# Patient Record
Sex: Female | Born: 1950 | Race: Black or African American | Hispanic: No | Smoking: Former smoker
Health system: Southern US, Community
[De-identification: ages and names within clinical notes are randomized; demographics above are authoritative.]

## PROBLEM LIST (undated history)

## (undated) DIAGNOSIS — D649 Anemia, unspecified: Secondary | ICD-10-CM

## (undated) DIAGNOSIS — N390 Urinary tract infection, site not specified: Secondary | ICD-10-CM

## (undated) DIAGNOSIS — E119 Type 2 diabetes mellitus without complications: Secondary | ICD-10-CM

## (undated) DIAGNOSIS — D573 Sickle-cell trait: Secondary | ICD-10-CM

## (undated) DIAGNOSIS — D219 Benign neoplasm of connective and other soft tissue, unspecified: Secondary | ICD-10-CM

## (undated) DIAGNOSIS — IMO0001 Reserved for inherently not codable concepts without codable children: Secondary | ICD-10-CM

## (undated) DIAGNOSIS — C541 Malignant neoplasm of endometrium: Secondary | ICD-10-CM

## (undated) DIAGNOSIS — K219 Gastro-esophageal reflux disease without esophagitis: Secondary | ICD-10-CM

## (undated) HISTORY — PX: TUBAL LIGATION: SHX77

## (undated) HISTORY — PX: TONSILLECTOMY: SUR1361

---

## 1999-09-22 ENCOUNTER — Emergency Department (HOSPITAL_COMMUNITY): Admission: EM | Admit: 1999-09-22 | Discharge: 1999-09-22 | Payer: Self-pay | Admitting: Emergency Medicine

## 1999-09-22 ENCOUNTER — Encounter: Payer: Self-pay | Admitting: Emergency Medicine

## 2001-02-02 ENCOUNTER — Other Ambulatory Visit: Admission: RE | Admit: 2001-02-02 | Discharge: 2001-02-02 | Payer: Self-pay | Admitting: Internal Medicine

## 2003-01-02 ENCOUNTER — Ambulatory Visit (HOSPITAL_COMMUNITY): Admission: RE | Admit: 2003-01-02 | Discharge: 2003-01-02 | Payer: Self-pay | Admitting: Gastroenterology

## 2010-10-02 ENCOUNTER — Ambulatory Visit: Payer: Self-pay | Admitting: Internal Medicine

## 2010-11-02 ENCOUNTER — Ambulatory Visit: Payer: Self-pay | Admitting: Internal Medicine

## 2014-03-07 ENCOUNTER — Emergency Department (HOSPITAL_BASED_OUTPATIENT_CLINIC_OR_DEPARTMENT_OTHER)
Admission: EM | Admit: 2014-03-07 | Discharge: 2014-03-07 | Disposition: A | Discharge status: Home / Self Care | Payer: BC Managed Care – PPO | Attending: Emergency Medicine | Admitting: Emergency Medicine

## 2014-03-07 ENCOUNTER — Encounter (HOSPITAL_BASED_OUTPATIENT_CLINIC_OR_DEPARTMENT_OTHER): Payer: Self-pay | Admitting: *Deleted

## 2014-03-07 DIAGNOSIS — Z862 Personal history of diseases of the blood and blood-forming organs and certain disorders involving the immune mechanism: Secondary | ICD-10-CM | POA: Diagnosis not present

## 2014-03-07 DIAGNOSIS — Z8742 Personal history of other diseases of the female genital tract: Secondary | ICD-10-CM | POA: Diagnosis not present

## 2014-03-07 DIAGNOSIS — R0981 Nasal congestion: Secondary | ICD-10-CM | POA: Diagnosis not present

## 2014-03-07 DIAGNOSIS — H81399 Other peripheral vertigo, unspecified ear: Secondary | ICD-10-CM

## 2014-03-07 DIAGNOSIS — R42 Dizziness and giddiness: Secondary | ICD-10-CM | POA: Diagnosis present

## 2014-03-07 DIAGNOSIS — Z87891 Personal history of nicotine dependence: Secondary | ICD-10-CM | POA: Insufficient documentation

## 2014-03-07 HISTORY — DX: Sickle-cell trait: D57.3

## 2014-03-07 HISTORY — DX: Benign neoplasm of connective and other soft tissue, unspecified: D21.9

## 2014-03-07 MED ORDER — MECLIZINE HCL 25 MG PO TABS: 25.0000 mg | ORAL_TABLET | Freq: Three times a day (TID) | ORAL | Status: DC | PRN

## 2014-03-07 NOTE — Discharge Instructions (Signed)

## 2014-03-07 NOTE — ED Notes (Signed)
Pt amb to triage with quick steady gait in nad. Pt reports having "a bad cold" x 2 weeks, yesterday pt noticed "the room was spinning around when I would turn different ways, it stopped when I closed my eyes..." pt denies any dizzy feeling at this time, denies any cp, sob or other c/o.

## 2014-03-07 NOTE — ED Notes (Signed)
C/o dizziness onset yesterday worse when lying on rt side  goes away when moving to rt side  States has had productive cough thick sputum x 2 weeks and congestion

## 2014-03-07 NOTE — ED Provider Notes (Signed)
CSN: [id number]     Arrival date & time 03/07/14  1705 History   First MD Initiated Contact with Patient 03/07/14 1946     This chart was scribed for Dorie Rank, MD by Forrestine Him, ED Scribe. This patient was seen in room MH07/MH07 and the patient's care was started 7:53 PM.   Chief Complaint  Patient presents with  . Dizziness   Patient is a 63 y.o. female presenting with dizziness. The history is provided by the patient. No language interpreter was used.  Dizziness Quality:  Head spinning Onset quality:  Sudden Duration:  2 weeks Timing:  Intermittent Progression:  Unchanged Chronicity:  New Context: head movement and standing up   Relieved by:  Closing eyes Worsened by:  Movement, standing up and turning head Ineffective treatments:  None tried Associated symptoms: no chest pain, no hearing loss, no shortness of breath and no vision changes     HPI Comments: Kristen Greene is a 63 y.o. female with a PMHx of sickle-cell trait who presents to the Emergency Department complaining of intermittent, moderate dizziness with associated difficulty keeping balance x 2 weeks. Dizziness is worsened with movment, turning from side to side, and standing. Symptoms are alleviated with sitting and when closing both eyes. States she recently had a "bad cold" several days ago and still reports ongoing congestion without any noticeable drainage. Kristen Greene denies any fever, SOB, CP, slurred speech, or vision changes. No known allergies to medications. No other concerns his visit.  Past Medical History  Diagnosis Date  . Sickle-cell trait   . Fibroids    History reviewed. No pertinent past surgical history. History reviewed. No pertinent family history. History  Substance Use Topics  . Smoking status: Former Research scientist (life sciences)  . Smokeless tobacco: Not on file  . Alcohol Use: Not on file   OB History    No data available     Review of Systems  Constitutional: Negative for fever and chills.  HENT:  Positive for congestion. Negative for hearing loss.   Eyes: Negative for visual disturbance.  Respiratory: Negative for shortness of breath.   Cardiovascular: Negative for chest pain.  Neurological: Positive for dizziness. Negative for speech difficulty.  All other systems reviewed and are negative.     Allergies  Review of patient's allergies indicates no known allergies.  Home Medications   Prior to Admission medications   Not on File   Triage Vitals: BP 155/96 mmHg  Pulse 85  Temp(Src) 98.2 F (36.8 C) (Oral)  Resp 18  SpO2 99%   Physical Exam  Constitutional: She is oriented to person, place, and time. She appears well-developed and well-nourished. No distress.  HENT:  Head: Normocephalic and atraumatic.  Right Ear: External ear normal.  Left Ear: External ear normal.  Mouth/Throat: Oropharynx is clear and moist.  Eyes: Conjunctivae are normal. Right eye exhibits no discharge. Left eye exhibits no discharge. No scleral icterus.  Neck: Neck supple. No tracheal deviation present.  Cardiovascular: Normal rate, regular rhythm and intact distal pulses.   Pulmonary/Chest: Effort normal and breath sounds normal. No stridor. No respiratory distress. She has no wheezes. She has no rales.  Abdominal: Soft. Bowel sounds are normal. She exhibits no distension. There is no tenderness. There is no rebound and no guarding.  Musculoskeletal: She exhibits no edema or tenderness.  Neurological: She is alert and oriented to person, place, and time. She has normal strength. No cranial nerve deficit (no facial droop, extraocular movements intact, no slurred speech)  or sensory deficit. She exhibits normal muscle tone. She displays no seizure activity. Coordination normal.  No pronator drift bilateral upper extrem, able to hold both legs off bed for 5 seconds, sensation intact in all extremities, no visual field cuts, no left or right sided neglect, normal finger-nose exam bilaterally, no  nystagmus noted   Skin: Skin is warm and dry. No rash noted.  Psychiatric: She has a normal mood and affect.  Nursing note and vitals reviewed.   ED Course  Procedures (including critical care time)  DIAGNOSTIC STUDIES: Oxygen Saturation is 99% on RA, Normal by my interpretation.    COORDINATION OF CARE: 7:52 PM-Discussed treatment plan with pt at bedside and pt agreed to plan.     Labs Review Labs Reviewed - No data to display  Imaging Review No results found.   EKG Interpretation   Date/Time:  Thursday March 07 2014 17:37:39 EST Ventricular Rate:  84 PR Interval:  162 QRS Duration: 102 QT Interval:  392 QTC Calculation: 463 R Axis:   -13 Text Interpretation:  Normal sinus rhythm Right atrial enlargement Minimal  voltage criteria for LVH, may be normal variant Nonspecific ST and T wave  abnormality , new since last tracing Confirmed by Prachi Oftedahl  MD-J, Corrin Sieling ([id number])  on 03/07/2014 5:44:58 PM      MDM   Final diagnoses:  Peripheral vertigo, unspecified laterality    The patient's symptoms are consistent with a viral upper respiratory infection. She is having some sinus congestion which I  Suspect is the etiology for her vertigo.  Normal neuro exam, doubt central cause.  Her symptoms are improving.  Will dc home with rx for meclizine.  Follow up with PCP.    I personally performed the services described in this documentation, which was scribed in my presence. The recorded information has been reviewed and is accurate.    Dorie Rank, MD 03/07/14 2022

## 2015-04-24 DIAGNOSIS — IMO0002 Reserved for concepts with insufficient information to code with codable children: Secondary | ICD-10-CM | POA: Diagnosis present

## 2015-04-24 DIAGNOSIS — N950 Postmenopausal bleeding: Secondary | ICD-10-CM | POA: Diagnosis present

## 2015-04-24 NOTE — H&P (Signed)
Kristen Greene is an 64 y.o. female.  [id number] with postmenoapusal bleeding and abnormal pap. Pt had her first pap visit in >10years on 03/31/15. It returned as HGSIL.  She reports underwent menopause in her 66s however in past 1-2 years begun having bleeding again. She saw a gynecologist a year ago who told her she had a polyp and needed surgery but he/she never followed up to schedule appt with her. She did know of a fibroid from her 61s. She denies any other associated symptoms such as bloating or pelvic pain. She denies any hot flashes or night sweats. U/S showed a thickended endometrium and a calcified fibroid in mid lower uterine wall. There was also an interuterine mass with a heterogenous echo pattern. Pt consented for D&C hysteroscopy, possible myosure and LEEP   Pertinent Gynecological History: Menses: post-menopausal Bleeding: post menopausal bleeding Contraception: none DES exposure: unknown Blood transfusions: none Sexually transmitted diseases: no past history Previous GYN Procedures: none  Last mammogram: normal Date: 04/2010 Last pap: abnormal: hgsil Date: 03/31/15 OB History: G4, P3013   Menstrual History: Menarche age: 74  No LMP recorded. Patient is postmenopausal.    Past Medical History  Diagnosis Date  . Sickle-cell trait   . Fibroids     No past surgical history on file.  No family history on file.  Social History:  reports that she has quit smoking. She does not have any smokeless tobacco history on file. Her alcohol and drug histories are not on file.  Allergies: No Known Allergies  No prescriptions prior to admission    Review of Systems  Constitutional: Negative for fever, chills, weight loss and malaise/fatigue.  Eyes: Negative for blurred vision.  Respiratory: Negative for shortness of breath.   Cardiovascular: Negative for chest pain.  Gastrointestinal: Positive for abdominal pain. Negative for heartburn, nausea and vomiting.  Musculoskeletal:  Negative for back pain.  Neurological: Negative for dizziness, weakness and headaches.  Psychiatric/Behavioral: Negative for depression. The patient is nervous/anxious.     There were no vitals taken for this visit. Physical Exam  Constitutional: She is oriented to person, place, and time. She appears well-developed and well-nourished.  HENT:  Head: Normocephalic.  Neck: Normal range of motion.  Cardiovascular: Normal rate.   Respiratory: Effort normal.  Genitourinary: Vagina normal and uterus normal.  Musculoskeletal: Normal range of motion.  Neurological: She is alert and oriented to person, place, and time.  Skin: Skin is warm.  Psychiatric: She has a normal mood and affect. Her behavior is normal. Judgment and thought content normal.    No results found for this or any previous visit (from the past 24 hour(s)).  No results found.  Assessment/Plan: Postmenopausal bleeding with ?endometrial fibroid and or mass, thickened endometrium and HGSIL on pap TO OR for D&C hysteroscopy, possible myosure and LEEP R/B reviewed  Sherlyn Hay 04/24/2015, 12:28 PM

## 2015-05-06 NOTE — Patient Instructions (Signed)
Your procedure is scheduled on:  Wednesday, Jan. 18, 2017  Enter through the [employer] [facility] at:  7:00 A.M.  Pick up the phone at the desk and dial [phone].  Call this number if you have problems the morning of surgery: [phone].  Remember: Do NOT eat food or drink after:  Midnight Tuesday Take these medicines the morning of surgery with a SIP OF WATER:  None  Do NOT wear jewelry (body piercing), metal hair clips/bobby pins, make-up, or nail polish. Do NOT wear lotions, powders, or perfumes.  You may wear deoderant. Do NOT shave for 48 hours prior to surgery. Do NOT bring valuables to the hospital. Contacts, dentures, or bridgework may not be worn into surgery. Have a responsible adult drive you home and stay with you for 24 hours after your procedure

## 2015-05-07 ENCOUNTER — Encounter (HOSPITAL_COMMUNITY): Payer: Self-pay

## 2015-05-07 ENCOUNTER — Emergency Department (HOSPITAL_COMMUNITY): Payer: No Typology Code available for payment source

## 2015-05-07 ENCOUNTER — Emergency Department (HOSPITAL_COMMUNITY)
Admission: EM | Admit: 2015-05-07 | Discharge: 2015-05-07 | Disposition: A | Discharge status: Home / Self Care | Payer: No Typology Code available for payment source | Attending: Emergency Medicine | Admitting: Emergency Medicine

## 2015-05-07 ENCOUNTER — Other Ambulatory Visit: Payer: Self-pay

## 2015-05-07 ENCOUNTER — Encounter (HOSPITAL_COMMUNITY)
Admission: RE | Admit: 2015-05-07 | Discharge: 2015-05-07 | Disposition: A | Discharge status: Home / Self Care | Payer: PPO | Source: Ambulatory Visit | Attending: Obstetrics and Gynecology | Admitting: Obstetrics and Gynecology

## 2015-05-07 DIAGNOSIS — R896 Abnormal cytological findings in specimens from other organs, systems and tissues: Secondary | ICD-10-CM | POA: Insufficient documentation

## 2015-05-07 DIAGNOSIS — Y998 Other external cause status: Secondary | ICD-10-CM | POA: Insufficient documentation

## 2015-05-07 DIAGNOSIS — N950 Postmenopausal bleeding: Secondary | ICD-10-CM | POA: Insufficient documentation

## 2015-05-07 DIAGNOSIS — Z01812 Encounter for preprocedural laboratory examination: Secondary | ICD-10-CM | POA: Insufficient documentation

## 2015-05-07 DIAGNOSIS — E119 Type 2 diabetes mellitus without complications: Secondary | ICD-10-CM | POA: Insufficient documentation

## 2015-05-07 DIAGNOSIS — Z87891 Personal history of nicotine dependence: Secondary | ICD-10-CM | POA: Insufficient documentation

## 2015-05-07 DIAGNOSIS — S4991XA Unspecified injury of right shoulder and upper arm, initial encounter: Secondary | ICD-10-CM | POA: Diagnosis present

## 2015-05-07 DIAGNOSIS — Z86018 Personal history of other benign neoplasm: Secondary | ICD-10-CM | POA: Diagnosis not present

## 2015-05-07 DIAGNOSIS — S43401A Unspecified sprain of right shoulder joint, initial encounter: Secondary | ICD-10-CM | POA: Diagnosis not present

## 2015-05-07 DIAGNOSIS — Y9389 Activity, other specified: Secondary | ICD-10-CM | POA: Diagnosis not present

## 2015-05-07 DIAGNOSIS — Y92410 Unspecified street and highway as the place of occurrence of the external cause: Secondary | ICD-10-CM | POA: Insufficient documentation

## 2015-05-07 DIAGNOSIS — Z862 Personal history of diseases of the blood and blood-forming organs and certain disorders involving the immune mechanism: Secondary | ICD-10-CM | POA: Insufficient documentation

## 2015-05-07 HISTORY — DX: Type 2 diabetes mellitus without complications: E11.9

## 2015-05-07 LAB — CBC
HCT: 31.9 % — ABNORMAL LOW (ref 36.0–46.0)
HEMOGLOBIN: 10.2 g/dL — AB (ref 12.0–15.0)
MCH: 24.9 pg — AB (ref 26.0–34.0)
MCHC: 32 g/dL (ref 30.0–36.0)
MCV: 78 fL (ref 78.0–100.0)
PLATELETS: 329 10*3/uL (ref 150–400)
RBC: 4.09 MIL/uL (ref 3.87–5.11)
RDW: 14.1 % (ref 11.5–15.5)
WBC: 5.4 10*3/uL (ref 4.0–10.5)

## 2015-05-07 LAB — BASIC METABOLIC PANEL
ANION GAP: 11 (ref 5–15)
BUN: 11 mg/dL (ref 6–20)
CALCIUM: 9.1 mg/dL (ref 8.9–10.3)
CO2: 27 mmol/L (ref 22–32)
CREATININE: 0.72 mg/dL (ref 0.44–1.00)
Chloride: 101 mmol/L (ref 101–111)
GLUCOSE: 101 mg/dL — AB (ref 65–99)
Potassium: 3.8 mmol/L (ref 3.5–5.1)
Sodium: 139 mmol/L (ref 135–145)

## 2015-05-07 MED ORDER — NAPROXEN 500 MG PO TABS: 500.0000 mg | ORAL_TABLET | Freq: Two times a day (BID) | ORAL | Status: DC

## 2015-05-07 MED ORDER — METHOCARBAMOL 500 MG PO TABS: 500.0000 mg | ORAL_TABLET | Freq: Two times a day (BID) | ORAL | Status: DC
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
## 2015-05-14 ENCOUNTER — Encounter (HOSPITAL_COMMUNITY)
Admission: RE | Disposition: A | Discharge status: Home / Self Care | Payer: Self-pay | Source: Ambulatory Visit | Attending: Obstetrics and Gynecology

## 2015-05-14 ENCOUNTER — Ambulatory Visit (HOSPITAL_COMMUNITY)
Admission: RE | Admit: 2015-05-14 | Discharge: 2015-05-14 | Disposition: A | Discharge status: Home / Self Care | Payer: PPO | Source: Ambulatory Visit | Attending: Obstetrics and Gynecology | Admitting: Obstetrics and Gynecology

## 2015-05-14 ENCOUNTER — Ambulatory Visit (HOSPITAL_COMMUNITY): Payer: PPO | Admitting: Certified Registered Nurse Anesthetist

## 2015-05-14 ENCOUNTER — Encounter (HOSPITAL_COMMUNITY): Payer: Self-pay | Admitting: Certified Registered Nurse Anesthetist

## 2015-05-14 DIAGNOSIS — Z87891 Personal history of nicotine dependence: Secondary | ICD-10-CM | POA: Diagnosis not present

## 2015-05-14 DIAGNOSIS — N950 Postmenopausal bleeding: Secondary | ICD-10-CM | POA: Insufficient documentation

## 2015-05-14 DIAGNOSIS — R87613 High grade squamous intraepithelial lesion on cytologic smear of cervix (HGSIL): Secondary | ICD-10-CM | POA: Diagnosis not present

## 2015-05-14 DIAGNOSIS — IMO0002 Reserved for concepts with insufficient information to code with codable children: Secondary | ICD-10-CM | POA: Diagnosis present

## 2015-05-14 HISTORY — PX: DILATATION & CURETTAGE/HYSTEROSCOPY WITH MYOSURE: SHX6511

## 2015-05-14 HISTORY — PX: LEEP: SHX91

## 2015-05-14 SURGERY — DILATATION & CURETTAGE/HYSTEROSCOPY WITH MYOSURE
Anesthesia: General | Site: Vagina

## 2015-05-14 MED ORDER — LIDOCAINE-EPINEPHRINE 1 %-1:100000 IJ SOLN
INTRAMUSCULAR | Status: DC | PRN
  Administered 2015-05-14: 10 mL
  Administered 2015-05-14: 8 mL

## 2015-05-14 MED ORDER — LIDOCAINE HCL (CARDIAC) 20 MG/ML IV SOLN
INTRAVENOUS | Status: DC | PRN
  Administered 2015-05-14: 50 mg via INTRAVENOUS

## 2015-05-14 MED ORDER — KETOROLAC TROMETHAMINE 30 MG/ML IJ SOLN
INTRAMUSCULAR | Status: AC
  Filled 2015-05-14: qty 1

## 2015-05-14 MED ORDER — KETOROLAC TROMETHAMINE 30 MG/ML IJ SOLN: 30.0000 mg | Freq: Once | INTRAMUSCULAR | Status: DC

## 2015-05-14 MED ORDER — MIDAZOLAM HCL 2 MG/2ML IJ SOLN
INTRAMUSCULAR | Status: AC
  Filled 2015-05-14: qty 2

## 2015-05-14 MED ORDER — LACTATED RINGERS IV SOLN
INTRAVENOUS | Status: DC
  Administered 2015-05-14 (×2): via INTRAVENOUS

## 2015-05-14 MED ORDER — PROPOFOL 10 MG/ML IV BOLUS
INTRAVENOUS | Status: AC
  Filled 2015-05-14: qty 20

## 2015-05-14 MED ORDER — EPHEDRINE SULFATE 50 MG/ML IJ SOLN
INTRAMUSCULAR | Status: DC | PRN
  Administered 2015-05-14 (×2): 10 mg via INTRAVENOUS

## 2015-05-14 MED ORDER — SODIUM CHLORIDE 0.9 % IR SOLN
Status: DC | PRN
  Administered 2015-05-14: 3000 mL

## 2015-05-14 MED ORDER — FERRIC SUBSULFATE SOLN
Status: DC | PRN
  Administered 2015-05-14: 1

## 2015-05-14 MED ORDER — ACETIC ACID 5 % SOLN
Status: AC
  Filled 2015-05-14: qty 500

## 2015-05-14 MED ORDER — ONDANSETRON HCL 4 MG/2ML IJ SOLN
INTRAMUSCULAR | Status: AC
  Filled 2015-05-14: qty 2

## 2015-05-14 MED ORDER — OXYCODONE-ACETAMINOPHEN 5-325 MG PO TABS: 1.0000 | ORAL_TABLET | ORAL | Status: DC | PRN

## 2015-05-14 MED ORDER — FENTANYL CITRATE (PF) 100 MCG/2ML IJ SOLN
INTRAMUSCULAR | Status: AC
  Filled 2015-05-14: qty 2

## 2015-05-14 MED ORDER — HYDROCODONE-ACETAMINOPHEN 7.5-325 MG PO TABS: 1.0000 | ORAL_TABLET | Freq: Once | ORAL | Status: DC | PRN

## 2015-05-14 MED ORDER — ONDANSETRON HCL 4 MG/2ML IJ SOLN: 4.0000 mg | Freq: Once | INTRAMUSCULAR | Status: DC | PRN

## 2015-05-14 MED ORDER — LIDOCAINE HCL 1 % IJ SOLN
INTRAMUSCULAR | Status: AC
  Filled 2015-05-14: qty 20

## 2015-05-14 MED ORDER — ACETIC ACID 4% SOLUTION
Status: DC | PRN
  Administered 2015-05-14: 1 via TOPICAL

## 2015-05-14 MED ORDER — FENTANYL CITRATE (PF) 100 MCG/2ML IJ SOLN: 25.0000 ug | INTRAMUSCULAR | Status: DC | PRN

## 2015-05-14 MED ORDER — IODINE STRONG (LUGOLS) 5 % PO SOLN
ORAL | Status: AC
  Filled 2015-05-14: qty 1

## 2015-05-14 MED ORDER — MIDAZOLAM HCL 2 MG/2ML IJ SOLN
INTRAMUSCULAR | Status: DC | PRN
  Administered 2015-05-14 (×2): 1 mg via INTRAVENOUS

## 2015-05-14 MED ORDER — LIDOCAINE-EPINEPHRINE 1 %-1:100000 IJ SOLN
INTRAMUSCULAR | Status: AC
  Filled 2015-05-14: qty 1

## 2015-05-14 MED ORDER — PROPOFOL 10 MG/ML IV BOLUS
INTRAVENOUS | Status: DC | PRN
  Administered 2015-05-14: 30 mg via INTRAVENOUS
  Administered 2015-05-14: 150 mg via INTRAVENOUS

## 2015-05-14 MED ORDER — EPHEDRINE 5 MG/ML INJ
INTRAVENOUS | Status: AC
  Filled 2015-05-14: qty 10

## 2015-05-14 MED ORDER — KETOROLAC TROMETHAMINE 30 MG/ML IJ SOLN
INTRAMUSCULAR | Status: DC | PRN
  Administered 2015-05-14: 30 mg via INTRAVENOUS

## 2015-05-14 MED ORDER — ONDANSETRON HCL 4 MG/2ML IJ SOLN
INTRAMUSCULAR | Status: DC | PRN
  Administered 2015-05-14: 4 mg via INTRAVENOUS

## 2015-05-14 MED ORDER — DEXAMETHASONE SODIUM PHOSPHATE 4 MG/ML IJ SOLN
INTRAMUSCULAR | Status: AC
  Filled 2015-05-14: qty 1

## 2015-05-14 MED ORDER — DEXAMETHASONE SODIUM PHOSPHATE 10 MG/ML IJ SOLN
INTRAMUSCULAR | Status: DC | PRN
  Administered 2015-05-14: 4 mg via INTRAVENOUS

## 2015-05-14 MED ORDER — IBUPROFEN 200 MG PO TABS: 600.0000 mg | ORAL_TABLET | Freq: Four times a day (QID) | ORAL | Status: DC | PRN

## 2015-05-14 MED ORDER — LACTATED RINGERS IV SOLN: INTRAVENOUS | Status: DC

## 2015-05-14 MED ORDER — MEPERIDINE HCL 25 MG/ML IJ SOLN
6.2500 mg | INTRAMUSCULAR | Status: DC | PRN
Start: 2015-05-14 — End: 2015-05-14

## 2015-05-14 MED ORDER — FENTANYL CITRATE (PF) 100 MCG/2ML IJ SOLN
INTRAMUSCULAR | Status: DC | PRN
  Administered 2015-05-14 (×2): 25 ug via INTRAVENOUS
  Administered 2015-05-14: 50 ug via INTRAVENOUS

## 2015-05-14 MED ORDER — FERRIC SUBSULFATE 259 MG/GM EX SOLN
CUTANEOUS | Status: AC
  Filled 2015-05-14: qty 8

## 2015-05-14 SURGICAL SUPPLY — 35 items
APPLICATOR COTTON TIP 6IN STRL (MISCELLANEOUS) ×3 IMPLANT
BLADE 11 SAFETY STRL DISP (BLADE) ×3 IMPLANT
CANISTER SUCT 3000ML (MISCELLANEOUS) ×3 IMPLANT
CATH ROBINSON RED A/P 16FR (CATHETERS) ×3 IMPLANT
CLOTH BEACON ORANGE TIMEOUT ST (SAFETY) ×3 IMPLANT
CONTAINER PREFILL 10% NBF 60ML (FORM) ×6 IMPLANT
COUNTER NEEDLE 1200 MAGNETIC (NEEDLE) ×2 IMPLANT
DEVICE MYOSURE LITE (MISCELLANEOUS) IMPLANT
DEVICE MYOSURE REACH (MISCELLANEOUS) IMPLANT
ELECT LOOP LEEP RND 15X12 GRN (CUTTING LOOP) ×3
ELECT LOOP LEEP RND 20X12 WHT (CUTTING LOOP)
ELECT REM PT RETURN 9FT ADLT (ELECTROSURGICAL) ×3
ELECTRODE LOOP LP RND 15X12GRN (CUTTING LOOP) IMPLANT
ELECTRODE LOOP LP RND 20X12WHT (CUTTING LOOP) IMPLANT
ELECTRODE REM PT RTRN 9FT ADLT (ELECTROSURGICAL) ×1 IMPLANT
EXTENDER ELECT LOOP LEEP 10CM (CUTTING LOOP) IMPLANT
GLOVE BIO SURGEON STRL SZ 6.5 (GLOVE) ×2 IMPLANT
GLOVE BIO SURGEONS STRL SZ 6.5 (GLOVE) ×1
GLOVE BIOGEL PI IND STRL 7.0 (GLOVE) ×2 IMPLANT
GLOVE BIOGEL PI INDICATOR 7.0 (GLOVE) ×4
GOWN STRL REUS W/TWL LRG LVL3 (GOWN DISPOSABLE) ×6 IMPLANT
HOSE NS SMOKE EVAC 7/8 X6 (MISCELLANEOUS) ×2 IMPLANT
HOSE NS SMOKE EVAC 7/8 X6' (MISCELLANEOUS) ×1
NS IRRIG 1000ML POUR BTL (IV SOLUTION) ×3 IMPLANT
PACK VAGINAL MINOR WOMEN LF (CUSTOM PROCEDURE TRAY) ×3 IMPLANT
PAD OB MATERNITY 4.3X12.25 (PERSONAL CARE ITEMS) ×3 IMPLANT
PENCIL BUTTON HOLSTER BLD 10FT (ELECTRODE) ×3 IMPLANT
REDUCER FITTING SMOKE EVAC (MISCELLANEOUS) ×3 IMPLANT
SCOPETTES 8  STERILE (MISCELLANEOUS) ×4
SCOPETTES 8 STERILE (MISCELLANEOUS) ×2 IMPLANT
TOWEL OR 17X24 6PK STRL BLUE (TOWEL DISPOSABLE) ×6 IMPLANT
TUBING AQUILEX INFLOW (TUBING) ×3 IMPLANT
TUBING AQUILEX OUTFLOW (TUBING) ×3 IMPLANT
TUBING SMOKE EVAC HOSE ADAPTER (MISCELLANEOUS) ×3 IMPLANT
WATER STERILE IRR 1000ML POUR (IV SOLUTION) ×3 IMPLANT

## 2015-05-14 NOTE — Interval H&P Note (Signed)
History and Physical Interval Note:  05/14/2015 8:16 AM    Kristen Greene  has presented today for surgery, with the diagnosis of postmenopausal bleeding, HGSIL  The various methods of treatment have been discussed with the patient and family. After consideration of risks, benefits and other options for treatment, the patient has consented to  Procedure(s): DILATATION & CURETTAGE/HYSTEROSCOPY WITH MYOSURE (N/A) LOOP ELECTROSURGICAL EXCISION PROCEDURE (LEEP) (N/A) as a surgical intervention .  The patient's history has been reviewed, patient examined, no change in status, stable for surgery.  I have reviewed the patient's chart and labs.  Questions were answered to the patient's satisfaction.     [place]

## 2015-05-14 NOTE — Discharge Instructions (Signed)
Nothing in vagina for 2-3 weeks.  No sex, tampons, and douching.   PLEASE do not take Motrin until after 3pm today. Increase water intake over the next few days. Dilation and Curettage or Vacuum Curettage, Care After Refer to this sheet in the next few weeks. These instructions provide you with information on caring for yourself after your procedure. Your health care provider may also give you more specific instructions. Your treatment has been planned according to current medical practices, but problems sometimes occur. Call your health care provider if you have any problems or questions after your procedure. WHAT TO EXPECT AFTER THE PROCEDURE After your procedure, it is typical to have light cramping and bleeding. This may last for 2 days to 2 weeks after the procedure. HOME CARE INSTRUCTIONS   Do not drive for 24 hours.  Wait 1 week before returning to strenuous activities.  Take your temperature 2 times a day for 4 days and write it down. Provide these temperatures to your health care provider if you develop a fever.  Avoid long periods of standing.  Avoid heavy lifting, pushing, or pulling. Do not lift anything heavier than 10 pounds (4.5 kg).  Limit stair climbing to once or twice a day.  Take rest periods often.  You may resume your usual diet.  Drink enough fluids to keep your urine clear or pale yellow.  Your usual bowel function should return. If you have constipation, you may:  Take a mild laxative with permission from your health care provider.  Add fruit and bran to your diet.  Drink more fluids.  Take showers instead of baths until your health care provider gives you permission to take baths.  Do not go swimming or use a hot tub until your health care provider approves.  Try to have someone with you or available to you the first 24-48 hours, especially if you were given a general anesthetic.  Do not douche, use tampons, or have sex (intercourse) for 2 weeks  after the procedure.  Only take over-the-counter or prescription medicines as directed by your health care provider. Do not take aspirin. It can cause bleeding.  Follow up with your health care provider as directed. SEEK MEDICAL CARE IF:   You have increasing cramps or pain that is not relieved with medicine.  You have abdominal pain that does not seem to be related to the same area of earlier cramping and pain.  You have bad smelling vaginal discharge.  You have a rash.  You are having problems with any medicine. SEEK IMMEDIATE MEDICAL CARE IF:   You have bleeding that is heavier than a normal menstrual period.  You have a fever.  You have chest pain.  You have shortness of breath.  You feel dizzy or feel like fainting.  You pass out.  You have pain in your shoulder strap area.  You have heavy vaginal bleeding with or without blood clots. MAKE SURE YOU:   Understand these instructions.  Will watch your condition.  Will get help right away if you are not doing well or get worse.   This information is not intended to replace advice given to you by your health care provider. Make sure you discuss any questions you have with your health care provider.   Document Released: 04/09/2000 Document Revised: 04/17/2013 Document Reviewed: 11/09/2012 Elsevier Interactive Patient Education 2016 [employer] Hysteroscopy   AFTER THE PROCEDURE   If you had a general anesthetic, you may be groggy for a  couple hours after the procedure.  If you had a local anesthetic, you will be able to go home as soon as you are stable and feel ready.  You may have some cramping. This normally lasts for a couple days.  You may have bleeding, which varies from light spotting for a few days to menstrual-like bleeding for 3-7 days. This is normal.  If your test results are not back during the visit, make an appointment with your health care provider to find out the results.   This  information is not intended to replace advice given to you by your health care provider. Make sure you discuss any questions you have with your health care provider.   Document Released: 07/19/2000 Document Revised: 01/31/2013 Document Reviewed: 11/09/2012 Elsevier Interactive Patient Education [employer].

## 2015-05-14 NOTE — Anesthesia Procedure Notes (Signed)
Procedure Name: LMA Insertion Date/Time: 05/14/2015 8:31 AM Performed by: Bufford Spikes Pre-anesthesia Checklist: Patient identified, Patient being monitored, Emergency Drugs available, Timeout performed and Suction available Patient Re-evaluated:Patient Re-evaluated prior to inductionOxygen Delivery Method: Circle system utilized Preoxygenation: Pre-oxygenation with 100% oxygen Intubation Type: IV induction Ventilation: Mask ventilation without difficulty LMA: LMA inserted LMA Size: 4.0 Tube type: Oral Number of attempts: 1 Placement Confirmation: positive ETCO2 and breath sounds checked- equal and bilateral Tube secured with: Tape Dental Injury: Teeth and Oropharynx as per pre-operative assessment

## 2015-05-14 NOTE — Anesthesia Preprocedure Evaluation (Signed)
Anesthesia Evaluation  Patient identified by MRN, date of birth, ID band Patient awake    Reviewed: Allergy & Precautions, H&P , NPO status , Patient's Chart, lab work & pertinent test results  Airway Mallampati: II  TM Distance: >3 FB Neck ROM: full    Dental no notable dental hx. (+) Teeth Intact   Pulmonary former smoker,    Pulmonary exam normal        Cardiovascular negative cardio ROS Normal cardiovascular exam     Neuro/Psych negative neurological ROS  negative psych ROS   GI/Hepatic negative GI ROS, Neg liver ROS,   Endo/Other  diabetes  Renal/GU negative Renal ROS     Musculoskeletal   Abdominal   Peds  Hematology negative hematology ROS (+)   Anesthesia Other Findings   Reproductive/Obstetrics negative OB ROS                             Anesthesia Physical Anesthesia Plan  ASA: II  Anesthesia Plan: General   Post-op Pain Management:    Induction: Intravenous  Airway Management Planned: LMA  Additional Equipment:   Intra-op Plan:   Post-operative Plan:   Informed Consent: I have reviewed the patients History and Physical, chart, labs and discussed the procedure including the risks, benefits and alternatives for the proposed anesthesia with the patient or authorized representative who has indicated his/her understanding and acceptance.     Plan Discussed with: CRNA and Surgeon  Anesthesia Plan Comments:         Anesthesia Quick Evaluation

## 2015-05-14 NOTE — Op Note (Signed)
See brief op note

## 2015-05-14 NOTE — Anesthesia Postprocedure Evaluation (Signed)
Anesthesia Post Note  Patient: Kristen Greene  Procedure(s) Performed: Procedure(s) (LRB): DILATATION & CURETTAGE/HYSTEROSCOPY  (N/A) LOOP ELECTROSURGICAL EXCISION PROCEDURE (LEEP) (N/A)  Patient location during evaluation: PACU Anesthesia Type: General Level of consciousness: awake Pain management: pain level controlled Vital Signs Assessment: post-procedure vital signs reviewed and stable Respiratory status: spontaneous breathing Cardiovascular status: stable Postop Assessment: no signs of nausea or vomiting Anesthetic complications: no    Last Vitals:  Filed Vitals:   05/14/15 0945 05/14/15 1000  BP: 143/78 134/79  Pulse: 92 89  Temp:  36.8 C  Resp: 14 19    Last Pain: There were no vitals filed for this visit.               [place]var[place]

## 2015-05-14 NOTE — Transfer of Care (Signed)
Immediate Anesthesia Transfer of Care Note  Patient: Kristen Greene  Procedure(s) Performed: Procedure(s): DILATATION & CURETTAGE/HYSTEROSCOPY  (N/A) LOOP ELECTROSURGICAL EXCISION PROCEDURE (LEEP) (N/A)  Patient Location: PACU  Anesthesia Type:General  Level of Consciousness: awake, alert  and oriented  Airway & Oxygen Therapy: Patient Spontanous Breathing and Patient connected to nasal cannula oxygen  Post-op Assessment: Report given to RN and Post -op Vital signs reviewed and stable  Post vital signs: Reviewed and stable  Last Vitals:  Filed Vitals:   05/14/15 0707  BP: 152/90  Pulse: 94  Temp: 36.9 C  Resp: 20    Complications: No apparent anesthesia complications

## 2015-05-14 NOTE — Brief Op Note (Signed)
05/14/2015  9:27 AM  PATIENT:  Kristen Greene  65 y.o. female  PRE-OPERATIVE DIAGNOSIS:  postmenopausal bleeding, HGSIL  POST-OPERATIVE DIAGNOSIS:  postmenopausal bleeding, HGSIL  PROCEDURE:  Procedure(s): DILATATION & CURETTAGE/HYSTEROSCOPY  (N/A) LOOP ELECTROSURGICAL EXCISION PROCEDURE (LEEP) (N/A)  SURGEON:  Surgeon(s) and Role:    * Cecilia Worema Banga, DO - Primary  PHYSICIAN ASSISTANT:   ASSISTANTS: none   ANESTHESIA:   general  EBL:  Total I/O In: 1000 [I.V.:1000] Out: 60 [Urine:50; Blood:10]  BLOOD ADMINISTERED:none  DRAINS: none   LOCAL MEDICATIONS USED:  1% LIDOCAINE with epi  and Amount: 18 ml  SPECIMEN:  Scraping-endometrial curretings  DISPOSITION OF SPECIMEN:  PATHOLOGY  COUNTS:  YES  TOURNIQUET:  * No tourniquets in log *  DICTATION: .Note written in EPIC  PLAN OF CARE: Discharge to home after PACU  PATIENT DISPOSITION:  PACU - hemodynamically stable.   Delay start of Pharmacological VTE agent (>24hrs) due to surgical blood loss or risk of bleeding: not applicable  Patient was taken to the operating room where anesthesia was obtained without difficulty. She was then prepped and draped in the normal sterile fashion in the dorsal lithotomy position. An appropriate time out was performed. A speculum was then placed within the vagina and the anterior lip of the cervix identified and grasped with a single toothed tenaculum. Uterus was then sounded to 10.5 cm.  The Pratt dilators were utilized to dilate the cervix up to approximately 16. There was a small amount of tissue noted at the cervical os- removed easily. The myosure scope was placed into uterine cavity. There was a significant amount of endometrial tissue noted obscuring good visualization despite increasing fluid flow. Thus, scope was removed and a sharp currettage performed. There was a significant amount of tissue evacuated. After several passes and with a mild gritty texture noted, the scope was  replaced and cavity was better visualized with ostia in normal anatomic position. No definate fibroid or residual mass noted. Three more passes with the curet were done with minimal tissue evacuated.  Next the tenaculum was removed and acetic acid liberally applied to the the cervix. A LEEP was then performed using the medium loop in two passes; the lower lip of cervix then the upper lip. The ball cautery was used to attain hemostasis and  monsels also liberally applied. Great hemostasis noted.  At this time all instruments were removed from the vagina and the patient awakened and taken to the recovery room in good condition.  Counts correct per nursing staff Endometrial curretings and LEEP specimen were sent to pathology for evaluation

## 2015-05-15 ENCOUNTER — Encounter (HOSPITAL_COMMUNITY): Payer: Self-pay | Admitting: Obstetrics and Gynecology

## 2015-05-30 ENCOUNTER — Other Ambulatory Visit (HOSPITAL_BASED_OUTPATIENT_CLINIC_OR_DEPARTMENT_OTHER): Payer: PPO

## 2015-05-30 ENCOUNTER — Ambulatory Visit: Payer: PPO | Attending: Gynecologic Oncology | Admitting: Gynecologic Oncology

## 2015-05-30 ENCOUNTER — Encounter: Payer: Self-pay | Admitting: Gynecologic Oncology

## 2015-05-30 VITALS — BP 157/83 | HR 85 | Temp 98.4°F | Resp 18 | Ht 61.0 in | Wt 168.1 lb

## 2015-05-30 DIAGNOSIS — D259 Leiomyoma of uterus, unspecified: Secondary | ICD-10-CM | POA: Insufficient documentation

## 2015-05-30 DIAGNOSIS — C541 Malignant neoplasm of endometrium: Secondary | ICD-10-CM

## 2015-05-30 DIAGNOSIS — Z87891 Personal history of nicotine dependence: Secondary | ICD-10-CM | POA: Insufficient documentation

## 2015-05-30 DIAGNOSIS — D573 Sickle-cell trait: Secondary | ICD-10-CM | POA: Insufficient documentation

## 2015-05-30 DIAGNOSIS — Z808 Family history of malignant neoplasm of other organs or systems: Secondary | ICD-10-CM | POA: Insufficient documentation

## 2015-05-30 DIAGNOSIS — E119 Type 2 diabetes mellitus without complications: Secondary | ICD-10-CM | POA: Diagnosis not present

## 2015-05-30 DIAGNOSIS — C7982 Secondary malignant neoplasm of genital organs: Secondary | ICD-10-CM | POA: Insufficient documentation

## 2015-05-30 LAB — BASIC METABOLIC PANEL
ANION GAP: 12 meq/L — AB (ref 3–11)
BUN: 12.6 mg/dL (ref 7.0–26.0)
CALCIUM: 9.3 mg/dL (ref 8.4–10.4)
CO2: 26 mEq/L (ref 22–29)
Chloride: 106 mEq/L (ref 98–109)
Creatinine: 0.8 mg/dL (ref 0.6–1.1)
EGFR: >90 mL/min/{1.73_m2} (ref >90)
Glucose: 97 mg/dl (ref 70–140)
POTASSIUM: 3.9 meq/L (ref 3.5–5.1)
Sodium: 144 mEq/L (ref 136–145)

## 2015-05-30 NOTE — Progress Notes (Signed)
Consult Note: Gyn-Onc  Consult was requested by Dr. Terri Piedra for the evaluation of Kristen Greene 65 y.o. female  CC:  Chief Complaint  Patient presents with  . endometrial cancer    New consultation    Assessment/Plan:  Ms. Kristen Greene  is a 65 y.o.  year old with stage IIIB serous endometrial cancer. She has pathologically confirmed cervical involvement and clinical upper left vaginal forniceal involvement.  I am recommending primary chemoradiation including brachytherapy. If she is exhibiting a good response to therapy, we would plan on an interval extrafascial hysterectomy 6 weeks after completing radiation. We would then provide 4 cycles of adjuvant carboplatin and paclitaxel.  We will first image with CT chest, abdo, pelvis and evaluate for distant metastastic disease that might alter treatment plans.  We will facilitate referral to medical oncology and radiation oncology to commence treatment and discuss her case at our multidisciplinary conference.  I discussed with the patient the aggressive nature of her tumor and the poorer prognosis associated with this disease.  HPI: The patient is a 65 year old gravida 35 para [id number] is seen in consultation at the request of Dr. Terri Piedra for serous endometrial cancer. The patient reports a history of postmenopausal bleeding for approximately 2 years. She was evaluated by her OB/GYN in December 2016 at which time her first Pap smear in approximate 10 years was performed and revealed HGSIL. She then underwent an ultrasound of the pelvis which revealed a thickened endometrial stripe and a calcified fibroid in the mid lower uterine segment wall. She was disposition to undergo a hysteroscopy D&C and LEEP procedure in the operating room.  On 05/14/2015 she was taken to the operating room for hysteroscopy D&C and LEEP procedure. The uterus was sounded to 10.5 cm. The hysteroscope revealed a significant amount of endometrial tissue. A D&C was performed as  well as a LEEP procedure. Pathology from the endometrial curettage revealed mixed endometrial carcinoma with predominately high-grade serous carcinoma and focal endometrioid adenocarcinoma, and the LEEP specimen revealed high-grade serous carcinoma.  Since his procedure the patient has done well. She reports ongoing light vaginal bleeding and discharge.  She is otherwise a fairly healthy woman though has a history of sickle cell trait requiring no interventions. Her only prior surgery was a laparotomy for tubal ligation. She's had 3 vaginal deliveries in the past. She is no family history significant for malignancy other than a maternal grandfather who had bone cancer. She is retired and lives alone.   Current Meds:  Outpatient Encounter Prescriptions as of 05/30/2015  Medication Sig  . Multiple Vitamin (THERA) TABS Take by mouth.  Marland Kitchen ibuprofen (MOTRIN IB) 200 MG tablet Take 3 tablets (600 mg total) by mouth every 6 (six) hours as needed. (Patient not taking: Reported on 05/30/2015)  . methocarbamol (ROBAXIN) 500 MG tablet Take 1 tablet (500 mg total) by mouth 2 (two) times daily. (Patient not taking: Reported on 05/30/2015)  . naproxen (NAPROSYN) 500 MG tablet Take 1 tablet (500 mg total) by mouth 2 (two) times daily. (Patient not taking: Reported on 05/30/2015)  . oxyCODONE-acetaminophen (ROXICET) 5-325 MG tablet Take 1 tablet by mouth every 4 (four) hours as needed for severe pain. (Patient not taking: Reported on 05/30/2015)  . [DISCONTINUED] meclizine (ANTIVERT) 25 MG tablet Take 1 tablet (25 mg total) by mouth 3 (three) times daily as needed for dizziness. (Patient not taking: Reported on 04/30/2015)   No facility-administered encounter medications on file as of 05/30/2015.    Allergy: No Known Allergies  Social Hx:   Social History   Social History  . Marital Status: Divorced    Spouse Name: N/A  . Number of Children: N/A  . Years of Education: N/A   Occupational History  . Not on file.    Social History Main Topics  . Smoking status: Former Research scientist (life sciences)  . Smokeless tobacco: Never Used  . Alcohol Use: No  . Drug Use: No  . Sexual Activity: Not on file   Other Topics Concern  . Not on file   Social History Narrative    Past Surgical Hx:  Past Surgical History  Procedure Laterality Date  . Tubal ligation    . Tonsillectomy    . Dilatation & curettage/hysteroscopy with myosure N/A 05/14/2015    Procedure: DILATATION & CURETTAGE/HYSTEROSCOPY ;  Surgeon: Sherlyn Hay, DO;  Location: [facility];  Service: Gynecology;  Laterality: N/A;  . [place]ep N/A 05/14/2015    Procedure: LOOP ELECTROSURGICAL EXCISION PROCEDURE (LEEP);  Surgeon: Sherlyn Hay, DO;  Location: [facility];  Service: Gynecology;  Laterality: N/A;    Past Medical Hx:  Past Medical History  Diagnosis Date  . Sickle-cell trait ([place])   . Fibroids   . Diabetes mellitus without complication ([place])     borderline    Past Gynecological History:  SVD x 3  No LMP recorded. Patient is postmenopausal.  Family Hx: History reviewed. No pertinent family history.  Review of Systems:  Constitutional  Feels well,    ENT Normal appearing ears and nares bilaterally Skin/Breast  No rash, sores, jaundice, itching, dryness Cardiovascular  No chest pain, shortness of breath, or edema  Pulmonary  No cough or wheeze.  Gastro Intestinal  No nausea, vomitting, or diarrhoea. No bright red blood per rectum, no abdominal pain, change in bowel movement, or constipation.  Genito Urinary  No frequency, urgency, dysuria, + postmenopausal bleeding Musculo Skeletal  No myalgia, arthralgia, joint swelling or pain  Neurologic  No weakness, numbness, change in gait,  Psychology  No depression, anxiety, insomnia.   Vitals:  Blood pressure 157/83, pulse 85, temperature 98.4 F (36.9 C), temperature source Oral, resp. rate 18, height 5\' 1"  (1.549 m), weight 168 lb 1.6 oz (76.25 kg), SpO2 100 %.  Physical Exam: WD in  NAD Neck  Supple NROM, without any enlargements.  Lymph Node Survey No cervical supraclavicular or inguinal adenopathy Cardiovascular  Pulse normal rate, regularity and rhythm. S1 and S2 normal.  Lungs  Clear to auscultation bilateraly, without wheezes/crackles/rhonchi. Good air movement.  Skin  No rash/lesions/breakdown  Psychiatry  Alert and oriented to person, place, and time  Abdomen  Normoactive bowel sounds, abdomen soft, non-tender and overweight without evidence of hernia.  Back No CVA tenderness Genito Urinary  Vulva/vagina: Normal external female genitalia.  No lesions. No discharge or bleeding.  Bladder/urethra:  No lesions or masses, well supported bladder  Vagina: there is encroachment of the cervical tumor onto the left anterior vaginal fornix.  Cervix: Enlarged to 5cm, friable ,nodular, grossly involved/replaced with tumor.  Uterus: Bulky 12cm size, minimally mobile, enlarged lower uterine segment. No apparent parametrial extension.   Adnexa: no discrete masses. Rectal  Good tone, no masses no cul de sac nodularity or parametrial extension Extremities  No bilateral cyanosis, clubbing or edema.   Donaciano Eva, MD  05/30/2015, 9:37 AM

## 2015-05-30 NOTE — Patient Instructions (Addendum)
Plan to have a CT scan of the chest, abdomen, and pelvis.  You will also appointments to meet with Dr. Gery Pray in Radiation Oncology and Dr. Evlyn Clines in Medical Oncology.  Plan for radiation with low dose chemotherapy followed by stronger chemotherapy with the possibility of surgery after that.  Please call for any questions or concerns.  Your appointment are subject to changes to sooner dates.  Thank you !

## 2015-05-31 LAB — CA 125: Cancer Antigen (CA) 125: 849 U/mL — ABNORMAL HIGH (ref 0.0–38.1)

## 2015-06-02 ENCOUNTER — Telehealth: Payer: Self-pay | Admitting: *Deleted

## 2015-06-02 NOTE — Telephone Encounter (Signed)
Notified pt of future appointments.

## 2015-06-03 ENCOUNTER — Encounter (HOSPITAL_COMMUNITY): Payer: Self-pay

## 2015-06-03 ENCOUNTER — Ambulatory Visit (HOSPITAL_COMMUNITY)
Admission: RE | Admit: 2015-06-03 | Discharge: 2015-06-03 | Disposition: A | Discharge status: Home / Self Care | Payer: PPO | Source: Ambulatory Visit | Attending: Gynecologic Oncology | Admitting: Gynecologic Oncology

## 2015-06-03 DIAGNOSIS — C541 Malignant neoplasm of endometrium: Secondary | ICD-10-CM | POA: Diagnosis present

## 2015-06-03 DIAGNOSIS — R590 Localized enlarged lymph nodes: Secondary | ICD-10-CM | POA: Diagnosis not present

## 2015-06-03 DIAGNOSIS — R1909 Other intra-abdominal and pelvic swelling, mass and lump: Secondary | ICD-10-CM | POA: Diagnosis not present

## 2015-06-03 DIAGNOSIS — D259 Leiomyoma of uterus, unspecified: Secondary | ICD-10-CM | POA: Insufficient documentation

## 2015-06-03 HISTORY — DX: Malignant neoplasm of endometrium: C54.1

## 2015-06-03 MED ORDER — IOHEXOL 300 MG/ML  SOLN
100.0000 mL | Freq: Once | INTRAMUSCULAR | Status: AC | PRN
  Administered 2015-06-03: 100 mL via INTRAVENOUS

## 2015-06-03 NOTE — Progress Notes (Signed)
GYN Location of Tumor / Histology:stage IIIB serous endometrial cancer. She has pathologically confirmed cervical involvement and clinical upper left vaginal forniceal involvement.   Kristen Greene presented with symptoms of: postmenopausal bleeding for approximately 2 years.   Biopsies revealed:   1/18/1/7 Diagnosis 1. Endometrium, curettage - MIXED ENDOMETRIAL CARCINOMA WITH PREDOMINATELY HIGH GRADE SEROUS CARCINOMA AND FOCAL ENDOMETRIOID ADENOCARINOMA, SEE COMMENT. 2. Cervix, LEEP - HIGH GRADE SEROUS CARCINOMA, SEE COMMENT.  Past/Anticipated interventions by Gyn/Onc surgery, if any: 05/14/15 -Procedure: DILATATION & CURETTAGE/HYSTEROSCOPY ;  Surgeon: Kristen Hay, DO;  Location: [facility];  Service: Gynecology;  Laterality: N/A; Procedure: LOOP ELECTROSURGICAL EXCISION PROCEDURE (LEEP);  Surgeon: Kristen Hay, DO;  Location: [facility];  Service: Gynecology;  Laterality: N/A;  Per Kristen Greene - plans for an interval extrafascial hysterectomy 6 weeks after completing radiation.     Past/Anticipated interventions by medical oncology, if any: apt with Kristen Greene 06/06/15 - plans for 4 cycles of adjuvant carboplatin and paclitaxel after surgery.  Weight changes, if any: None  Bowel/Bladder complaints, if any: Yes.  , Bladder leakage.  No bowel complaints  Nausea/Vomiting, if any: nausea from contrast  Pain issues, if any:  no  SAFETY ISSUES:  Prior radiation? no  Pacemaker/ICD? no  Possible current pregnancy? no  Is the patient on methotrexate? no  Current Complaints / other details:  Kristen Greene is recommending primary chemoradiation including brachytherapy.  CT of chest/abdomen/pelvis done 06/03/15 Wants review.

## 2015-06-04 ENCOUNTER — Ambulatory Visit
Admission: RE | Admit: 2015-06-04 | Discharge: 2015-06-04 | Disposition: A | Discharge status: Home / Self Care | Payer: PPO | Source: Ambulatory Visit | Attending: Radiation Oncology | Admitting: Radiation Oncology

## 2015-06-04 ENCOUNTER — Encounter: Payer: Self-pay | Admitting: Radiation Oncology

## 2015-06-04 VITALS — BP 132/79 | HR 97 | Temp 98.5°F | Ht 61.0 in | Wt 168.8 lb

## 2015-06-04 DIAGNOSIS — C7982 Secondary malignant neoplasm of genital organs: Secondary | ICD-10-CM

## 2015-06-04 DIAGNOSIS — C541 Malignant neoplasm of endometrium: Secondary | ICD-10-CM

## 2015-06-04 NOTE — Progress Notes (Signed)
Radiation Oncology         [phone]) [phone] ________________________________  Initial Outpatient Consultation  Name: Kristen Greene MRN: [id number]  Date: 06/04/2015  DOB: Oct 26, 1950  CC:No PCP Per Patient  Everitt Amber, MD   REFERRING PHYSICIAN: Everitt Amber, MD  DIAGNOSIS: The encounter diagnosis was Endometrial cancer, FIGO stage IIIB ([place]).  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 female who presented to Dr. Terri Piedra on 04/24/15 for a 1-2 year history of post-menopausal bleeding. The patient had a Pap smear on 03/31/15 that returned as HGSIL. She had a pelvic ultrasound that showed a thickened endometrium and a calcified fibroid in the mid-lower uterine wall. There was also an interuterine mass with a heterogenous echo pattern. On 05/14/15, she presented to surgery for a dilation and curettage/hysteroscopy with myosure loop electrosurgical excision proceedure. 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 She presented to Dr. Denman George on 05/30/15 who recommended primary chemoradiation with brachytherapy, then a hysterectomy 6 weeks later, and 4 cycles of adjuvant chemotherapy afterwards. CT of the chest/abd/pelvis on 06/03/15 revealed a 7.4 x 4.0 x 6.3 cm soft tissue mass in the pelvis cul-de-sac involving the lower uterine corpus and cervix, and also abuts the anterior wall of the rectum. There is mild lymphadenopathy in the abdominal retroperitoneum, bilateral iliac chains, and right inguinal region consistent with metastasis. A 3 cm anterior calcified uterine fibroid was also noted. There is no evidence of metastatic disease in the thorax nor evidence of hydronephrosis. The patient presents to me for my opinion regarding radiation for the  management of her disease  PREVIOUS RADIATION THERAPY: No  PAST MEDICAL HISTORY:  has a past medical history of Sickle-cell trait ([place]); Fibroids; Diabetes mellitus without complication ([place]); and Endometrial cancer ([place]) (dx'd 04/2015).    PAST SURGICAL HISTORY: Past Surgical History  Procedure Laterality Date  . Tubal ligation    . Tonsillectomy    . Dilatation & curettage/hysteroscopy with myosure N/A 05/14/2015    Procedure: DILATATION & CURETTAGE/HYSTEROSCOPY ;  Surgeon: Sherlyn Hay, DO;  Location: [facility];  Service: Gynecology;  Laterality: N/A;  . [place]ep N/A 05/14/2015    Procedure: LOOP ELECTROSURGICAL EXCISION PROCEDURE (LEEP);  Surgeon: Sherlyn Hay, DO;  Location: [facility];  Service: Gynecology;  Laterality: N/A;    FAMILY HISTORY: family history is not on file.  SOCIAL HISTORY:  reports that she has quit smoking. She has never used smokeless tobacco. She reports that she does not drink alcohol or use illicit drugs.  ALLERGIES: Review of patient's allergies indicates no known allergies.  MEDICATIONS:  Current Outpatient Prescriptions  Medication Sig Dispense Refill  . Multiple Vitamin (THERA) TABS Take by mouth.    Marland Kitchen ibuprofen (MOTRIN IB) 200 MG tablet Take 3 tablets (600 mg total) by mouth every 6 (six) hours as needed. (Patient not taking: Reported on 05/30/2015) 40 tablet 1  . methocarbamol (ROBAXIN) 500 MG tablet Take 1 tablet (500 mg total) by mouth 2 (two) times daily. (Patient not taking: Reported on 05/30/2015) 20 tablet 0  . naproxen (NAPROSYN) 500 MG tablet Take 1 tablet (500 mg total) by mouth 2 (two) times daily. (Patient not taking: Reported on 05/30/2015) 30 tablet 0  . oxyCODONE-acetaminophen (ROXICET) 5-325 MG tablet Take 1 tablet by mouth every 4 (four) hours as needed for  severe pain. (Patient not taking: Reported on 05/30/2015) 15 tablet 0   No current facility-administered medications for this encounter.    REVIEW OF SYSTEMS:  A 15 point review of  systems is documented in the electronic medical record. This was obtained by the nursing staff. However, I reviewed this with the patient to discuss relevant findings and make appropriate changes.  Pertinent items are noted in HPI.   The patient reports bladder leakage, but no bowel complaints. She reports nausea from the contrast. She reports issues with her knees that makes it hard for her to walk. She denies pelvic pain. She reports her post-menopausal bleeding was intermittent with either light, heavy, or no bleeding.   PHYSICAL EXAM:  height is 5\' 1"  (1.549 m) and weight is 168 lb 12.8 oz (76.567 kg). Her temperature is 98.5 F (36.9 C). Her blood pressure is 132/79 and her pulse is 97.   Alert and oriented x3. The patient presents to the clinic in a wheelchair. Lungs are clear to auscultation bilaterally. Heart has regular rate and rhythm. No palpable cervical, supraclavicular, or axillary adenopathy. Oropharynx is clear with no lesions. PERRL. EOMI. Bilateral arcus senilis. Motor strength is 5 out of 5 in the proximal and distal muscle groups of the lower and upper extremities.  The patient politely declined a pelvic exam today. Abdomen soft non-tender, small vertical scar in the lower abdomen from her previous tubal ligation. Approximately a 2.5 x 2 cm right inguinal lymph node  ECOG = 1  LABORATORY DATA:  Lab Results  Component Value Date   WBC 5.4 05/07/2015   HGB 10.2* 05/07/2015   HCT 31.9* 05/07/2015   MCV 78.0 05/07/2015   PLT 329 05/07/2015   Lab Results  Component Value Date   NA 144 05/30/2015   K 3.9 05/30/2015   CL 101 05/07/2015   CO2 26 05/30/2015   GLUCOSE 97 05/30/2015   CREATININE 0.8 05/30/2015   CALCIUM 9.3 05/30/2015      RADIOGRAPHY: Dg Shoulder Right  05/07/2015  CLINICAL DATA:  Right-sided shoulder pain EXAM: RIGHT SHOULDER - 2+ VIEW COMPARISON:  None. FINDINGS: Degenerative changes of the acromioclavicular joint are noted. No acute fracture or  dislocation is seen. No soft tissue abnormality is noted. IMPRESSION: No acute abnormality seen. Electronically Signed   By: Inez Catalina M.D.   On: 05/07/2015 13:41   Ct Chest W Contrast  06/03/2015  CLINICAL DATA:  Newly diagnosed high-grade uterine carcinoma with cervical and vaginal involvement. Weight loss. EXAM: CT CHEST, ABDOMEN, AND PELVIS WITH CONTRAST TECHNIQUE: Multidetector CT imaging of the chest, abdomen and pelvis was performed following the standard protocol during bolus administration of intravenous contrast. CONTRAST:  117mL OMNIPAQUE IOHEXOL 300 MG/ML  SOLN COMPARISON:  None. FINDINGS: CT CHEST FINDINGS Mediastinum/Lymph Nodes: No masses, pathologically enlarged lymph nodes, or other significant abnormality. Lungs/Pleura: No pulmonary mass, infiltrate, or effusion. Mild scarring noted in medial right lung base. Musculoskeletal: No chest wall mass or suspicious bone lesions identified. CT ABDOMEN PELVIS FINDINGS Hepatobiliary: No masses or other significant abnormality. Pancreas: No mass, inflammatory changes, or other significant abnormality. Spleen: Within normal limits in size and appearance. Adrenals/Urinary Tract: No masses identified. No evidence of hydronephrosis. Left parapelvic renal cyst incidentally noted. Stomach/Bowel: No evidence of obstruction, inflammatory process, or abnormal fluid collections. Vascular/Lymphatic: Mild abdominal retroperitoneal lymphadenopathy is seen in the left paraaortic and aortocaval space is. Index lymph noted in the left paraaortic region measures 1.6 cm in short axis on image 62/series 2.  Mild bilateral iliac lymphadenopathy is also seen with largest lymph node in the right external iliac chain measuring 1.8 cm on image 96 and in the left external iliac chain measuring 1.2 cm on image 90. Right inguinal lymphadenopathy is also seen measuring 2.3 x 1.7 cm on image 103/series 2. Reproductive: A calcified fibroid is seen in the anterior uterine corpus  measuring 3.5 cm. Low low-attenuation soft tissue mass is seen involving the posterior margin of the lower uterine corpus and cervix and occupying the pelvic cul-de-sac. This measures 7.4 x 4.0 by 6.3 cm and abuts the anterior wall of the rectum. This mass does not appear to involve the vagina or bladder. Other: None. Musculoskeletal:  No suspicious bone lesions identified. IMPRESSION: 7.4 cm soft tissue mass in the pelvic cul-de-sac which involves the lower uterine corpus and cervix, and also abuts the anterior wall the rectum. Mild lymphadenopathy in the abdominal retroperitoneum, bilateral iliac chains, and right inguinal region, consistent with metastatic disease. No evidence of metastatic disease within the thorax. 3 cm anterior calcified uterine fibroid incidentally noted. No evidence of hydronephrosis. Electronically Signed   By: Earle Gell M.D.   On: 06/03/2015 15:29   Ct Abdomen Pelvis W Contrast  06/03/2015  CLINICAL DATA:  Newly diagnosed high-grade uterine carcinoma with cervical and vaginal involvement. Weight loss. EXAM: CT CHEST, ABDOMEN, AND PELVIS WITH CONTRAST TECHNIQUE: Multidetector CT imaging of the chest, abdomen and pelvis was performed following the standard protocol during bolus administration of intravenous contrast. CONTRAST:  115mL OMNIPAQUE IOHEXOL 300 MG/ML  SOLN COMPARISON:  None. FINDINGS: CT CHEST FINDINGS Mediastinum/Lymph Nodes: No masses, pathologically enlarged lymph nodes, or other significant abnormality. Lungs/Pleura: No pulmonary mass, infiltrate, or effusion. Mild scarring noted in medial right lung base. Musculoskeletal: No chest wall mass or suspicious bone lesions identified. CT ABDOMEN PELVIS FINDINGS Hepatobiliary: No masses or other significant abnormality. Pancreas: No mass, inflammatory changes, or other significant abnormality. Spleen: Within normal limits in size and appearance. Adrenals/Urinary Tract: No masses identified. No evidence of hydronephrosis. Left  parapelvic renal cyst incidentally noted. Stomach/Bowel: No evidence of obstruction, inflammatory process, or abnormal fluid collections. Vascular/Lymphatic: Mild abdominal retroperitoneal lymphadenopathy is seen in the left paraaortic and aortocaval space is. Index lymph noted in the left paraaortic region measures 1.6 cm in short axis on image 62/series 2. Mild bilateral iliac lymphadenopathy is also seen with largest lymph node in the right external iliac chain measuring 1.8 cm on image 96 and in the left external iliac chain measuring 1.2 cm on image 90. Right inguinal lymphadenopathy is also seen measuring 2.3 x 1.7 cm on image 103/series 2. Reproductive: A calcified fibroid is seen in the anterior uterine corpus measuring 3.5 cm. Low low-attenuation soft tissue mass is seen involving the posterior margin of the lower uterine corpus and cervix and occupying the pelvic cul-de-sac. This measures 7.4 x 4.0 by 6.3 cm and abuts the anterior wall of the rectum. This mass does not appear to involve the vagina or bladder. Other: None. Musculoskeletal:  No suspicious bone lesions identified. IMPRESSION: 7.4 cm soft tissue mass in the pelvic cul-de-sac which involves the lower uterine corpus and cervix, and also abuts the anterior wall the rectum. Mild lymphadenopathy in the abdominal retroperitoneum, bilateral iliac chains, and right inguinal region, consistent with metastatic disease. No evidence of metastatic disease within the thorax. 3 cm anterior calcified uterine fibroid incidentally noted. No evidence of hydronephrosis. Electronically Signed   By: Earle Gell M.D.   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 CT scans from yesterday showed pelvic, para aortic, and inguinal adenopathy, but no distant metastases. The patient is a good candidate for chemoradiation and brachytherapy.  PLAN: I spoke to the patient today  regarding her diagnosis and options for treatment. We discussed the role of chemoradiation and brachytherapy for managing her disease. We discussed the process of CT simulation and the placement tattoos. We discussed 5 weeks of external radiation treatment and brachytherapy as an outpatient. We discussed the low likelihood of secondary malignancies. We discussed the possible side effects including but not limited to skin redness, fatigue, permanent skin darkening, diarrhea, rectal discomfort, nausea, urinary urgency/discomfort, and bowel obstruction. The patient signed a consent form and this was placed in her medical chart.  The patient is scheduled to see Dr. Marko Plume on Friday. CT simulation has been scheduled next Tuesday at [facility].   ------------------------------------------------  Blair Promise, PhD, MD  This document serves as a record of services personally performed by Gery Pray, MD. It was created on his behalf by Darcus Austin, a trained medical scribe. The creation of this record is based on the scribe's personal observations and the provider's statements to them. This document has been checked and approved by the attending provider.

## 2015-06-04 NOTE — Addendum Note (Signed)
Encounter addended by: Benn Moulder, RN on: 06/04/2015  6:58 PM<BR>     Documentation filed: [facility] Follow-up Actions, Flowsheet VN, Visit Diagnoses, Dx Association, Orders

## 2015-06-05 ENCOUNTER — Encounter: Payer: Self-pay | Admitting: *Deleted

## 2015-06-05 ENCOUNTER — Other Ambulatory Visit: Payer: Self-pay | Admitting: Oncology

## 2015-06-05 ENCOUNTER — Other Ambulatory Visit: Payer: PPO

## 2015-06-05 DIAGNOSIS — C541 Malignant neoplasm of endometrium: Secondary | ICD-10-CM

## 2015-06-06 ENCOUNTER — Ambulatory Visit (HOSPITAL_BASED_OUTPATIENT_CLINIC_OR_DEPARTMENT_OTHER): Payer: PPO | Admitting: Oncology

## 2015-06-06 ENCOUNTER — Telehealth: Payer: Self-pay

## 2015-06-06 ENCOUNTER — Other Ambulatory Visit (HOSPITAL_BASED_OUTPATIENT_CLINIC_OR_DEPARTMENT_OTHER): Payer: PPO

## 2015-06-06 ENCOUNTER — Telehealth: Payer: Self-pay | Admitting: Oncology

## 2015-06-06 ENCOUNTER — Encounter: Payer: Self-pay | Admitting: Oncology

## 2015-06-06 VITALS — BP 153/91 | HR 85 | Temp 97.9°F | Resp 18 | Ht 61.0 in | Wt 168.3 lb

## 2015-06-06 DIAGNOSIS — C541 Malignant neoplasm of endometrium: Secondary | ICD-10-CM | POA: Diagnosis not present

## 2015-06-06 DIAGNOSIS — C7989 Secondary malignant neoplasm of other specified sites: Secondary | ICD-10-CM | POA: Diagnosis not present

## 2015-06-06 DIAGNOSIS — D500 Iron deficiency anemia secondary to blood loss (chronic): Secondary | ICD-10-CM

## 2015-06-06 DIAGNOSIS — D509 Iron deficiency anemia, unspecified: Secondary | ICD-10-CM | POA: Diagnosis not present

## 2015-06-06 DIAGNOSIS — D573 Sickle-cell trait: Secondary | ICD-10-CM | POA: Diagnosis not present

## 2015-06-06 LAB — COMPREHENSIVE METABOLIC PANEL
ANION GAP: 10 meq/L (ref 3–11)
AST: 11 U/L (ref 5–34)
Albumin: 3.3 g/dL — ABNORMAL LOW (ref 3.5–5.0)
Alkaline Phosphatase: 86 U/L (ref 40–150)
BUN: 7.9 mg/dL (ref 7.0–26.0)
CALCIUM: 9.1 mg/dL (ref 8.4–10.4)
CHLORIDE: 107 meq/L (ref 98–109)
CO2: 24 meq/L (ref 22–29)
CREATININE: 0.7 mg/dL (ref 0.6–1.1)
Glucose: 96 mg/dl (ref 70–140)
POTASSIUM: 4.2 meq/L (ref 3.5–5.1)
Sodium: 142 mEq/L (ref 136–145)
Total Bilirubin: <0.3 mg/dL (ref 0.20–1.20)
Total Protein: 7.8 g/dL (ref 6.4–8.3)

## 2015-06-06 LAB — CBC WITH DIFFERENTIAL/PLATELET
BASO%: 1.1 % (ref 0.0–2.0)
BASOS ABS: 0.1 10*3/uL (ref 0.0–0.1)
EOS%: 2.9 % (ref 0.0–7.0)
Eosinophils Absolute: 0.1 10*3/uL (ref 0.0–0.5)
HEMATOCRIT: 31.2 % — AB (ref 34.8–46.6)
HGB: 9.9 g/dL — ABNORMAL LOW (ref 11.6–15.9)
LYMPH#: 0.8 10*3/uL — AB (ref 0.9–3.3)
LYMPH%: 17.4 % (ref 14.0–49.7)
MCH: 24 pg — AB (ref 25.1–34.0)
MCHC: 31.7 g/dL (ref 31.5–36.0)
MCV: 75.7 fL — ABNORMAL LOW (ref 79.5–101.0)
MONO#: 0.3 10*3/uL (ref 0.1–0.9)
MONO%: 7.3 % (ref 0.0–14.0)
NEUT#: 3.4 10*3/uL (ref 1.5–6.5)
NEUT%: 71.3 % (ref 38.4–76.8)
PLATELETS: 292 10*3/uL (ref 145–400)
RBC: 4.11 10*6/uL (ref 3.70–5.45)
RDW: 14.9 % — ABNORMAL HIGH (ref 11.2–14.5)
WBC: 4.8 10*3/uL (ref 3.9–10.3)

## 2015-06-06 LAB — IRON AND TIBC
%SAT: 8 % — AB (ref 21–57)
IRON: 18 ug/dL — AB (ref 41–142)
TIBC: 223 ug/dL — AB (ref 236–444)
UIBC: 205 ug/dL (ref 120–384)

## 2015-06-06 LAB — MAGNESIUM: MAGNESIUM: 2.5 mg/dL (ref 1.5–2.5)

## 2015-06-06 MED ORDER — LORAZEPAM 0.5 MG PO TABS: 0.5000 mg | ORAL_TABLET | Freq: Three times a day (TID) | ORAL | Status: DC

## 2015-06-06 MED ORDER — ONDANSETRON HCL 8 MG PO TABS: 8.0000 mg | ORAL_TABLET | Freq: Three times a day (TID) | ORAL | Status: DC | PRN

## 2015-06-06 MED ORDER — FERROUS FUMARATE 325 (106 FE) MG PO TABS: 1.0000 | ORAL_TABLET | Freq: Every day | ORAL | Status: DC

## 2015-06-06 NOTE — Telephone Encounter (Signed)
Returning pt's call that [facility] may not have hemocyte until Monday.

## 2015-06-06 NOTE — Telephone Encounter (Signed)
S/w pt that prescriptions were e-scribed

## 2015-06-06 NOTE — Telephone Encounter (Signed)
-----   Message from Gordy Levan, MD sent at 06/06/2015 10:50 AM EST ----- Uses [facility]"  Generics always fine  Hemocyte if insurance covers or tell pharmacist DAW ferrous fumarate 325 mg on empty stomach with OJ #30 3 RF  Zofran 8 mg   One every 8 hrs as needed for nausea, will not make drowsy   #30  1 RF  Ativan 0.5 mg   One SL or po every 6 hrs as needed for nausea. Will make drowsy  #20   thanks

## 2015-06-06 NOTE — Telephone Encounter (Signed)
-----   Message from Gordy Levan, MD sent at 06/06/2015 10:50 AM EST ----- Uses [facility] [address]"  Generics always fine  Hemocyte if insurance covers or tell pharmacist DAW ferrous fumarate 325 mg on empty stomach with OJ #30 3 RF  Zofran 8 mg   One every 8 hrs as needed for nausea, will not make drowsy   #30  1 RF  Ativan 0.5 mg   One SL or po every 6 hrs as needed for nausea. Will make drowsy  #20   thanks

## 2015-06-06 NOTE — Telephone Encounter (Signed)
Gave patient avs report and appointments for February and March.  °

## 2015-06-06 NOTE — Progress Notes (Signed)
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 female who is seen in consultation, alone for visit, at the request of Dr Denman George, for reason as above.  Patient does not presently have PCP, tho she has previously seen PCP physician in [place]. She presented in early Dec 2016 with intermittent postmenopausal bleeding x 1-2 years; last PAP >10 years prior. Dr Mickle Mallory obtained PAP 03-31-15 with HGSIL and [place] with thickened endometrium and intrauterine mass. 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 She saw Dr Denman George on 05-30-15, with exam noting encroachment of tumor onto the left anterior vaginal fornix, cervix 5 cm, friable, grossly replaced with tumor, uterus bulky with enlarged lower uterine segment and no apparent parametrial extension;  Recommendation was for staging CT CAP and likely for initial chemoradiation + brachytherapy, then, if responding well to treatment, to have extrafascial hysterectomy 6 weeks out from completion of radiation, followed by additional 4 cycles of adjuvant carboplatin taxol. CT CAP 06-03-2015 showed no metastatic disease in chest or other organs, a 7.4 x 4.0 x 6.3 cm mass involving uterine corpus and cervix, and abutting anterior wall of rectum, some adenopathy retroperitoneal/ bilateral iliac, right inguinal regions. She saw Dr Sondra Come in consultation on 06-04-15 and will have simulation on 06-10-15; further RT  schedule not yet available.    REVIEW OF SYSTEMS  Minimal spotting since D&C, never heavy prior No other bleeding No pelvic pain Voiding without difficulty, bowels moving regularly without medication. No GERD, nausea, vomiting. No abdominal pain No HA. Good visual acuity with glasses. Minimal seasonal allergies. No difficulty hearing. No history thyroid disease. No recent dental exam tho is known to a dentist locally, no active problems known, partial dentures.  No SOB, chest pain, cardiac symptoms No swelling LE. 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 No other arthritis.  Some positional numbness in hands thought carpal tunnel No changes in breasts.  No difficulty with IV access Right inguinal LN per patient present for a number of years Remainder of full 10 point review of systems negative.   ALLERGIES: Review of patient's allergies indicates no known allergies.  PAST MEDICAL/ SURGICAL HISTORY:     G4P3 Sickle trait: occasional various pain, never hospitalized or treated Borderline DM, watches diet BTL Tonsils Colonoscopy ~ 7 years ago (possibly Dr Benson Norway or Collene Mares) which she recalls was completely clear Mammograms at Dr Ivor Costa office in past month, reportedly unremarkable  CURRENT MEDICATIONS: reviewed as listed now in EMR. WIll begin ferrous fumarate as name brand or DAW OTC, 325 mg on empty stomach with OJ daily. Zofran, ativan prn nausea  PHARMACY [facility] in [place] area  SOCIAL HISTORY:  Patient originally from [place], where she now lives alone tho 24 and 71 yo grandchildren (whom she raised after daughter's death) visit regularly. Past tobacco, no ETOH. 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 Retired 09-2014. 2 sons live in [place] and [place].    FAMILY HISTORY:  Daughter died sickle  cell age 83 One son and 2 grands with sickle trait Maternal grandfather "bone  cancer" Brother prostate cancer Mother DM         PHYSICAL EXAM:  height is '5\' 1"'$  (1.549 m) and weight is 168 lb 4.8 oz (76.34 kg). Her oral temperature is 97.9 F (36.6 C). Her blood pressure is 153/91 and her pulse is 85. Her respiration is 18 and oxygen saturation is 100%.  Alert, pleasant, cooperative lady looks stated age. Using WC for length of office halls, but ambulates in exam room and able to get on and off exam table with minimal assistance. Respirations not labored RA. Good historian. Does not appear uncomfortable.  HEENT: normal hair pattern. PERRL, not icteric. Oral mucosa and posterior pharynx clear, no obvious dental concerns. Neck supple without JVD or thyroid mass  RESPIRATORY: lungs clear to A and P  CARDIAC/ VASCULAR: heart RRR no gallop, clear heart sounds. Peripheral pulses symmetrical. UE veins look adequate for chemotherapy  ABDOMEN: soft, not tender, not obviously distended. No palpable HSM or mass. Normal bowel sounds  LYMPH NODES: some soft fullness right supraclavicular without frank adenopathy, no cervical/ axillary/ left inguinal adenopathy. Right inguinal node medially ~ 2 cm   BREASTS:bilaterally without dominant mass, skin or nipple findings  NEUROLOGIC:CN, motor, cerebellar, sensory not focal. PSYCH appropriate mood and affect  SKIN:without rash, ecchymosis, petechiae  MUSCULOSKELETAL: back not tender. LE no pitting edema, cords, tenderness. Lower legs some bowing    LABORATORY DATA:  Results for orders placed or performed in visit on 06/06/15 (from the past 48 hour(s))  CBC with Differential     Status: Abnormal   Collection Time: 06/06/15  8:52 AM  Result Value Ref Range   WBC 4.8 3.9 - 10.3 10e3/uL   NEUT# 3.4 1.5 - 6.5 10e3/uL   HGB 9.9 (L) 11.6 - 15.9 g/dL   HCT 31.2 (L) 34.8 - 46.6 %   Platelets 292 145 - 400 10e3/uL   MCV 75.7 (L) 79.5 - 101.0 fL   MCH 24.0 (L) 25.1 - 34.0 pg   MCHC 31.7 31.5 - 36.0 g/dL   RBC 4.11 3.70 - 5.45  10e6/uL   RDW 14.9 (H) 11.2 - 14.5 %   lymph# 0.8 (L) 0.9 - 3.3 10e3/uL   MONO# 0.3 0.1 - 0.9 10e3/uL   Eosinophils Absolute 0.1 0.0 - 0.5 10e3/uL   Basophils Absolute 0.1 0.0 - 0.1 10e3/uL   NEUT% 71.3 38.4 - 76.8 %   LYMPH% 17.4 14.0 - 49.7 %   MONO% 7.3 0.0 - 14.0 %   EOS% 2.9 0.0 - 7.0 %   BASO% 1.1 0.0 - 2.0 %  Comprehensive metabolic panel     Status: Abnormal   Collection Time: 06/06/15  8:52 AM  Result Value Ref Range   Sodium 142 136 - 145 mEq/L   Potassium 4.2 3.5 - 5.1 mEq/L   Chloride 107 98 - 109 mEq/L   CO2 24 22 - 29 mEq/L   Glucose 96 70 - 140 mg/dl    Comment: Glucose reference range is for nonfasting patients. Fasting glucose reference range is 70- 100.   BUN 7.9 7.0 - 26.0 mg/dL   Creatinine 0.7 0.6 - 1.1 mg/dL   Total Bilirubin <0.30 0.20 - 1.20 mg/dL   Alkaline Phosphatase 86 40 - 150 U/L   AST 11 5 - 34 U/L   ALT <9 0 - 55 U/L   Total Protein 7.8 6.4 - 8.3 g/dL   Albumin 3.3 (L)  3.5 - 5.0 g/dL   Calcium 9.1 8.4 - 10.4 mg/dL   Anion Gap 10 3 - 11 mEq/L   EGFR >90 >90 ml/min/1.73 m2    Comment: eGFR is calculated using the CKD-EPI Creatinine Equation (2009)  Magnesium - [facility]     Status: None   Collection Time: 06/06/15  8:52 AM  Result Value Ref Range   Magnesium 2.5 1.5 - 2.5 mg/dl  Iron and TIBC     Status: Abnormal   Collection Time: 06/06/15  8:52 AM  Result Value Ref Range   Iron 18 (L) 41 - 142 ug/dL   TIBC 223 (L) 236 - 444 ug/dL   UIBC 205 120 - 384 ug/dL   %SAT 8 (L) 21 - 57 %    All labs reviewed with patient at time of visit  PATHOLOGY: ANGELIZ, SETTLEMYRE Collected: 05/14/2015 Client: [facility] Accession: [id number] Received: 05/14/2015 Carlynn Purl, DO DOB: 06/28/1950 Age: 22 Gender: F Reported: 05/15/2015 [place] Patient Ph: [phone] MRN #: [id number] [place], [place] [address] Visit #: [id number] Chart #: Phone: [phone] Fax: CC: REPORT OF SURGICAL PATHOLOGY FINAL DIAGNOSIS Diagnosis 1. Endometrium,  curettage - MIXED ENDOMETRIAL CARCINOMA WITH PREDOMINATELY HIGH GRADE SEROUS CARCINOMA AND FOCAL ENDOMETRIOID ADENOCARINOMA, SEE COMMENT. 2. Cervix, LEEP - HIGH GRADE SEROUS CARCINOMA, SEE COMMENT. Microscopic Comment 1. There is extensive high grade serous carcinoma (95%) with a minor component of lower grade (FIGO 1-2) endometrioid adenocarcinoma. Dr. Gari Crown has reviewed the case. Dr. Ivor Costa office was called on 05/15/2015. 2. There is extensive high grade serous carcinoma involving the cervical stroma and surface. There is lymphovascular invasion. There are only focal areas of residual squamous epithelium which are benign.  RADIOGRAPHY: EXAM: CT CHEST, ABDOMEN, AND PELVIS WITH CONTRAST 06-03-15  TECHNIQUE: Multidetector CT imaging of the chest, abdomen and pelvis was performed following the standard protocol during bolus administration of intravenous contrast.  CONTRAST: 143m OMNIPAQUE IOHEXOL 300 MG/ML SOLN  COMPARISON: None.  FINDINGS: CT CHEST FINDINGS  Mediastinum/Lymph Nodes: No masses, pathologically enlarged lymph nodes, or other significant abnormality.  Lungs/Pleura: No pulmonary mass, infiltrate, or effusion. Mild scarring noted in medial right lung base.  Musculoskeletal: No chest wall mass or suspicious bone lesions identified.  CT ABDOMEN PELVIS FINDINGS  Hepatobiliary: No masses or other significant abnormality.  Pancreas: No mass, inflammatory changes, or other significant abnormality.  Spleen: Within normal limits in size and appearance.  Adrenals/Urinary Tract: No masses identified. No evidence of hydronephrosis. Left parapelvic renal cyst incidentally noted.  Stomach/Bowel: No evidence of obstruction, inflammatory process, or abnormal fluid collections.  Vascular/Lymphatic: Mild abdominal retroperitoneal lymphadenopathy is seen in the left paraaortic and aortocaval space is. Index lymph noted in the left paraaortic region  measures 1.6 cm in short axis on image 62/series 2. Mild bilateral iliac lymphadenopathy is also seen with largest lymph node in the right external iliac chain measuring 1.8 cm on image 96 and in the left external iliac chain measuring 1.2 cm on image 90. Right inguinal lymphadenopathy is also seen measuring 2.3 x 1.7 cm on image 103/series 2.  Reproductive: A calcified fibroid is seen in the anterior uterine corpus measuring 3.5 cm. Low low-attenuation soft tissue mass is seen involving the posterior margin of the lower uterine corpus and cervix and occupying the pelvic cul-de-sac. This measures 7.4 x 4.0 by 6.3 cm and abuts the anterior wall of the rectum. This mass does not appear to involve the vagina or bladder.  Other: None.  Musculoskeletal: No suspicious bone lesions  identified.  IMPRESSION: 7.4 cm soft tissue mass in the pelvic cul-de-sac which involves the lower uterine corpus and cervix, and also abuts the anterior wall the rectum.  Mild lymphadenopathy in the abdominal retroperitoneum, bilateral iliac chains, and right inguinal region, consistent with metastatic disease.  No evidence of metastatic disease within the thorax.  3 cm anterior calcified uterine fibroid incidentally noted. No evidence of hydronephrosis.    DISCUSSION: All of history above reviewed with patient now. She knows results of the CT from discussion with Dr Sondra Come, and attended chemotherapy teaching class prior to this visit. We have discussed recommendation for sensitizing chemotherapy with radiation intially, then possible surgery, then likely additional chemotherapy (did not discuss carbo taxol in detail now). We have gone over outpatient administration of CDDP including oral and IV hydration; RN has given her another copy of the CDDP prehydration instructions. I have discussed possible side effects of the chemotherapy including N/V and use of antiemetics, possible hair loss, drop in blood  counts, rationale for good hydration with this agent. She has given verbal consent for treatment. As radiation schedule is still pending, I have tentatively set up weekly chemo x 5 beginning 06-16-15, but will adjust this if needed depending on timing of radiation. I will see her back during treatment and she will have weekly labs with CDDP  Verbal consent given for chemotherapy.   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  2.iron deficiency anemia: begin oral iron, with IV iron and/or PRBCs if needed. She is not obviously symptomatic from the anemia, tho no CBCs prior to early 04-2015 in this EMR 3.sickle cell trait 4.flu vaccine not documented, will follow up as she should have this as soon as possible if not already done 5.up to date mammograms and colonoscopy 6.chronic orthopedic problems legs 7.past tobacco 8.BTL, tonsillectomy 9.Advance Directives in place  Patient had questions answered to their satisfaction and is in agreement with plan above. She can contact this office for questions or concerns at any time prior to next scheduled visit. CDDP orders entered, also granix if needed; managed care notified. Message to radiation oncology to coordinate start of treatment; for now will hold infusion spots weekly x 5 beginning 06-16-15. Time spent 50 min, including >50% discussion and coordination of care. Cc Drs Denman George, Pamelia Hoit, MD 06/06/2015 10:16 AM

## 2015-06-06 NOTE — Telephone Encounter (Signed)
Called [facility] and they need PA for the hemocyte and zofran, asked them to fax PA request to [place]. Told pt to pay cash for hemocyte or get an OTC preparation to hold her over until hemocyte PA done. Pt was appreciative of the help.

## 2015-06-06 NOTE — Patient Instructions (Signed)
We will send prescriptions to your pharmacy:  Iron, either Hemocyte or same type as over the counter ferrous fumarate.  Take one tablet daily with OJ, on empty stomach (one hour before meals or 2 hrs after meals)  Zofran (ondansetron)  8 mg    One tablet every 8 hrs as needed for nausea after chemo. Will not make you drowsy  Ativan (lorazepam) 0.5 mg   One tablet swallow or dissolve under tongue every 6 hrs as needed for nausea. Will make you drowsy and a little forgetful around each dose. Don't drive if you take this one

## 2015-06-09 ENCOUNTER — Telehealth: Payer: Self-pay

## 2015-06-09 ENCOUNTER — Encounter: Payer: Self-pay | Admitting: Oncology

## 2015-06-09 NOTE — Progress Notes (Signed)
i sent prior auth req to [facility] for ondansetron and hemocyte

## 2015-06-09 NOTE — Progress Notes (Signed)
I left prior auth forms for dr. Marko Plume for ondanseteron and hemocyte.

## 2015-06-09 NOTE — Telephone Encounter (Signed)
2/10 pof noted and most appointments are in place,added 3/3 md visit and patient will get a new avs 2/20

## 2015-06-10 ENCOUNTER — Telehealth: Payer: Self-pay | Admitting: Oncology

## 2015-06-10 ENCOUNTER — Ambulatory Visit: Admission: RE | Admit: 2015-06-10 | Payer: PPO | Source: Ambulatory Visit | Admitting: Radiation Oncology

## 2015-06-10 ENCOUNTER — Encounter: Payer: Self-pay | Admitting: Oncology

## 2015-06-10 ENCOUNTER — Ambulatory Visit
Admission: RE | Admit: 2015-06-10 | Discharge: 2015-06-10 | Disposition: A | Discharge status: Home / Self Care | Payer: PPO | Source: Ambulatory Visit | Attending: Radiation Oncology | Admitting: Radiation Oncology

## 2015-06-10 NOTE — Progress Notes (Signed)
I faxed [facility] request form for hemocyte and ondansetron with notes to  [phone]

## 2015-06-10 NOTE — Telephone Encounter (Signed)
Called Kristen Greene regarding her canceled CT [facility] appointment today.  She said that she is feeling weak today.  She also said that she has not received her iron and nausea prescriptions yet and does not want to start anything until she receives them.  She said she is waiting for insurance approval.  She said that she is having a small amount of vaginal bleeding.  Advised her that radiation will help to stop the bleeding and that we need to reschedule her CT [facility] appointment as soon as possible.  Advised that the CT [facility] appointment is for planning her treatment.  Asked if she would like to reschedule now.  Kristen Greene said she would call back to reschedule.

## 2015-06-11 ENCOUNTER — Encounter: Payer: Self-pay | Admitting: Oncology

## 2015-06-11 ENCOUNTER — Ambulatory Visit: Payer: PPO

## 2015-06-11 ENCOUNTER — Other Ambulatory Visit: Payer: Self-pay

## 2015-06-11 ENCOUNTER — Ambulatory Visit: Payer: PPO | Admitting: Radiation Oncology

## 2015-06-11 DIAGNOSIS — D500 Iron deficiency anemia secondary to blood loss (chronic): Secondary | ICD-10-CM

## 2015-06-11 MED ORDER — FERROUS FUMARATE 325 (106 FE) MG PO TABS: ORAL_TABLET | ORAL | Status: AC

## 2015-06-11 NOTE — Telephone Encounter (Signed)
Patient Demographics     Patient Name Sex DOB SSN Address Phone    Dominga, Lammons Female 1950-10-08 [id number] [place] [id number] [phone] (Home) [phone] (Mobile)      Message  Received: 5 days ago    Gordy Levan, MD  Baruch Merl, RN; Janace Hoard, RN           I am not sure that she has had flu vaccine, none listed in EMR.  With chemo soon, I would like for her to have flu vaccine 2-13 or 2-14 if not already done.   thanks

## 2015-06-11 NOTE — Progress Notes (Signed)
Called patient and left voicemail to introduce myself as Estate manager/land agent and see if she has any financial questions or concerns. Plan to discuss [place] and [place] if she qualifies.

## 2015-06-11 NOTE — Telephone Encounter (Signed)
Told Kristen Greene that her ondansetron prescription was approved earlier this week and is waiting for her to pick up.  C0-pay is $1.10. The Hemocyte tabs are not covered by a lot of insurances.  Dr. Marko Plume sent a prescription in for Ferrous fumarate 324 mg this evening.  The pharmacy has to order this and it will be ready for pick up Friday 06-13-15 after 3 pm.  Reviewed taking it on an empty stomach( 1 hour prior or 2 hours after eating) with OJ or Vit. C tab 500 mg.  Reiterated that the Radiation appointment that she cancelled was not for a treatment but for planing the radiation treatment just for her.  This planning  takes several days after she leaves the building. The radiation and chemotherapy are given together.  She needs to call Radiation dept tomorrow and speak with Varney Baas with Dr. Sondra Come to get appointment asap.   Depending on the timing of her appointment with simulation she may not receive chemotherapy next week. Kristen Greene stated that she would call Radiation in the morning.   Had spoken with Kristen Greene on 06-09-15 about the Flu vaccine and she refused it.  May want to broach this again if she does R/S simulation.

## 2015-06-11 NOTE — Progress Notes (Signed)
Left message again for karen [phone] [id number] in regards to auth for ondansetron and hemocyte note were faxed with both forms to [phone]- per nures [facility] needed more info and said to call [phone] with eoc#

## 2015-06-11 NOTE — Progress Notes (Signed)
Spoke with pharmacy this evening and the zofran was approved.   Ferrous fumarate was called in  To replace the Hemocyte order.No further calls  Need to be made.  Thans.

## 2015-06-12 ENCOUNTER — Encounter: Payer: Self-pay | Admitting: Oncology

## 2015-06-12 NOTE — Progress Notes (Signed)
Per envision ondansetron was approved 06/11/15-06/10/15. I sent to medical records.

## 2015-06-12 NOTE — Telephone Encounter (Signed)
I cannot tell that patient has gotten rescheduled for RT simulation etc.  If not starting RT week of 2-20, she will not need CDDP on 2-20 (or labs that day) May need to adjust further chemo for same reason  Please follow up with patient to encourage her to proceed. Keep my next apt as scheduled so we don't lose her  thanks

## 2015-06-13 ENCOUNTER — Other Ambulatory Visit: Payer: PPO

## 2015-06-13 ENCOUNTER — Telehealth: Payer: Self-pay | Admitting: Oncology

## 2015-06-13 NOTE — Telephone Encounter (Addendum)
Called Ashlin and asked if she was ready to reschedule her CT SIM.  Brenley said she was not and needed to go to [facility] today to pick up her Iron and would call us when she is ready.  Advised her again that the CT [facility] appointment was for a planning scan and that it was not a treatment.  Kiyla then said she could schedule it for Monday.  Transferred her to [place], RT to reschedule.

## 2015-06-13 NOTE — Telephone Encounter (Signed)
Elmo Putt, RN stated that she spoke with Ms. Delair and is scheduled for CT Simulation at 1000 on Wednesday 06-18-15. Cancelled chemotherapy appointment and lab for 06-18-15.

## 2015-06-16 ENCOUNTER — Ambulatory Visit: Payer: PPO

## 2015-06-16 ENCOUNTER — Ambulatory Visit: Payer: PPO | Admitting: Oncology

## 2015-06-16 ENCOUNTER — Other Ambulatory Visit: Payer: PPO

## 2015-06-16 NOTE — Telephone Encounter (Signed)
Re scheduling chemo and RT  Please check on RT schedule later this week, to be sure correct to start chemo as set up  Would try to keep my next apt as set up, whether or not she has gotten treatment going by then  thanks

## 2015-06-17 ENCOUNTER — Encounter: Payer: Self-pay | Admitting: Oncology

## 2015-06-17 NOTE — Progress Notes (Signed)
Sent notes again to envision for denial of hemocyte

## 2015-06-18 ENCOUNTER — Telehealth: Payer: Self-pay

## 2015-06-18 ENCOUNTER — Ambulatory Visit
Admission: RE | Admit: 2015-06-18 | Discharge: 2015-06-18 | Disposition: A | Discharge status: Home / Self Care | Payer: PPO | Source: Ambulatory Visit | Attending: Radiation Oncology | Admitting: Radiation Oncology

## 2015-06-18 NOTE — Telephone Encounter (Signed)
Looking for flu vaccine information. Never reviewed.

## 2015-06-18 NOTE — Telephone Encounter (Signed)
-----   Message from Gordy Levan, MD sent at 06/06/2015  7:40 PM EST ----- I am not sure that she has had flu vaccine, none listed in EMR. With chemo soon, I would like for her to have flu vaccine 2-13 or 2-14 if not already done.  thanks

## 2015-06-19 NOTE — Telephone Encounter (Signed)
Called Kristen Greene to remind her of her appointment with Dr. Marko Plume on Monday 06-23-15. She said that it could be cancelled as she has a conflicting appointment. Upon further inquiry Kristen Greene stated that she is not sure she wants to proceed with treatment.  She further explained that her Medicaid was cancelled and has to try to get this reinstated.   She does not know if the [employer] advantage insurance was cancelled as well. Discussed  That there are assistance programs that she can apply to for funds.  Our Engineer, water and SSW can look into resources that might be available to her. She is feeling overwhelmed and feels like saying the heck with everything. Encouraged her to keep appointment with Dr. Marko Plume to see how her blood counts are doing and discuss a plan of care that she is comfortable pursuing. Kristen Greene stated she would keep appointment 06-23-15 with Dr. Marko Plume.

## 2015-06-20 ENCOUNTER — Encounter: Payer: Self-pay | Admitting: Oncology

## 2015-06-20 ENCOUNTER — Other Ambulatory Visit: Payer: Self-pay

## 2015-06-20 DIAGNOSIS — C541 Malignant neoplasm of endometrium: Secondary | ICD-10-CM

## 2015-06-20 NOTE — Progress Notes (Signed)
Hemocyte plus capsule was denied again under part d. Medical necessity of the requested drug does not concern the decision. Part d will not pay for it. I let dr Marko Plume know.

## 2015-06-20 NOTE — Telephone Encounter (Signed)
Spoke with Stefanie Libel, Patient Financial Advocate.She will reach out to Ms. Inacio and try to set up an appointment with her on 06-23-15 while at [facility] to see Dr. Marko Plume. Spoke with Polo Riley, SSW and discussed situation with patient as noted below.  She will reach out to Ms. Fadness as well by phone and will see her 06-23-15 if Ms. Moorehead keeps her appointment.

## 2015-06-20 NOTE — Progress Notes (Signed)
Called patient to schedule a meeting with me for financial questions or concerns. Asked patient if she could meet with me on Mon 2/27 after her appointment with Dr.Livesay to discuss and offer FA. Patient agreed and I advised her what to bring to our appointment. Patient has my name and number to contact me for any additional questions or concerns. Patient was very Patent attorney.

## 2015-06-20 NOTE — Telephone Encounter (Signed)
Be sure financial staff speaks to her - note patient cancelling treatment due to Medicaid cancelled etc.   Please let support staff know she needs attention  thanks

## 2015-06-21 ENCOUNTER — Other Ambulatory Visit: Payer: Self-pay | Admitting: Oncology

## 2015-06-21 DIAGNOSIS — C541 Malignant neoplasm of endometrium: Secondary | ICD-10-CM

## 2015-06-23 ENCOUNTER — Other Ambulatory Visit (HOSPITAL_BASED_OUTPATIENT_CLINIC_OR_DEPARTMENT_OTHER): Payer: PPO

## 2015-06-23 ENCOUNTER — Encounter: Payer: Self-pay | Admitting: Gynecologic Oncology

## 2015-06-23 ENCOUNTER — Telehealth: Payer: Self-pay

## 2015-06-23 ENCOUNTER — Other Ambulatory Visit: Payer: PPO

## 2015-06-23 ENCOUNTER — Ambulatory Visit: Payer: PPO

## 2015-06-23 ENCOUNTER — Telehealth: Payer: Self-pay | Admitting: Oncology

## 2015-06-23 ENCOUNTER — Ambulatory Visit (HOSPITAL_BASED_OUTPATIENT_CLINIC_OR_DEPARTMENT_OTHER): Payer: PPO | Admitting: Oncology

## 2015-06-23 ENCOUNTER — Encounter: Payer: Self-pay | Admitting: Oncology

## 2015-06-23 VITALS — BP 111/58 | HR 85 | Temp 99.1°F | Resp 18 | Ht 61.0 in | Wt 166.4 lb

## 2015-06-23 DIAGNOSIS — Z9111 Patient's noncompliance with dietary regimen: Secondary | ICD-10-CM

## 2015-06-23 DIAGNOSIS — D509 Iron deficiency anemia, unspecified: Secondary | ICD-10-CM

## 2015-06-23 DIAGNOSIS — N950 Postmenopausal bleeding: Secondary | ICD-10-CM

## 2015-06-23 DIAGNOSIS — C541 Malignant neoplasm of endometrium: Secondary | ICD-10-CM

## 2015-06-23 DIAGNOSIS — D573 Sickle-cell trait: Secondary | ICD-10-CM | POA: Diagnosis not present

## 2015-06-23 DIAGNOSIS — Z91199 Patient's noncompliance with other medical treatment and regimen due to unspecified reason: Secondary | ICD-10-CM

## 2015-06-23 DIAGNOSIS — D500 Iron deficiency anemia secondary to blood loss (chronic): Secondary | ICD-10-CM

## 2015-06-23 LAB — CBC WITH DIFFERENTIAL/PLATELET
BASO%: 0.6 % (ref 0.0–2.0)
BASOS ABS: 0 10*3/uL (ref 0.0–0.1)
EOS ABS: 0.1 10*3/uL (ref 0.0–0.5)
EOS%: 2.2 % (ref 0.0–7.0)
HCT: 30.8 % — ABNORMAL LOW (ref 34.8–46.6)
HGB: 9.8 g/dL — ABNORMAL LOW (ref 11.6–15.9)
LYMPH%: 16.3 % (ref 14.0–49.7)
MCH: 23.5 pg — AB (ref 25.1–34.0)
MCHC: 31.6 g/dL (ref 31.5–36.0)
MCV: 74.4 fL — AB (ref 79.5–101.0)
MONO#: 0.5 10*3/uL (ref 0.1–0.9)
MONO%: 9.7 % (ref 0.0–14.0)
NEUT#: 3.5 10*3/uL (ref 1.5–6.5)
NEUT%: 71.2 % (ref 38.4–76.8)
Platelets: 290 10*3/uL (ref 145–400)
RBC: 4.15 10*6/uL (ref 3.70–5.45)
RDW: 15.5 % — ABNORMAL HIGH (ref 11.2–14.5)
WBC: 5 10*3/uL (ref 3.9–10.3)
lymph#: 0.8 10*3/uL — ABNORMAL LOW (ref 0.9–3.3)

## 2015-06-23 LAB — COMPREHENSIVE METABOLIC PANEL
ALBUMIN: 3.3 g/dL — AB (ref 3.5–5.0)
ALK PHOS: 76 U/L (ref 40–150)
ALT: <9 U/L (ref 0–55)
AST: 10 U/L (ref 5–34)
Anion Gap: 10 mEq/L (ref 3–11)
BUN: 8.7 mg/dL (ref 7.0–26.0)
CALCIUM: 9.2 mg/dL (ref 8.4–10.4)
CHLORIDE: 107 meq/L (ref 98–109)
CO2: 26 mEq/L (ref 22–29)
Creatinine: 0.7 mg/dL (ref 0.6–1.1)
GLUCOSE: 95 mg/dL (ref 70–140)
POTASSIUM: 4.1 meq/L (ref 3.5–5.1)
SODIUM: 142 meq/L (ref 136–145)
Total Bilirubin: <0.3 mg/dL (ref 0.20–1.20)
Total Protein: 7.9 g/dL (ref 6.4–8.3)

## 2015-06-23 MED ORDER — DEXAMETHASONE 4 MG PO TABS: ORAL_TABLET | ORAL | Status: DC

## 2015-06-23 NOTE — Telephone Encounter (Signed)
Ms. Drach called and stated that [facility] does not have one of the medications for nausea sent in on 06-06-15. ( Pt never picked them up).  Please be sure the Ativan and Zofran prescriptions are there and the Decadron prescription  e-prescribed 06-23-15

## 2015-06-23 NOTE — Progress Notes (Signed)
Met with patient in person to introduce myself as Estate manager/land agent. Patient had several concerns today. Patient had a denial form from her insurance for a medication. Gave copy of letter to Raquel whom was aware. Her Medicaid had terminated and she never received a letter from [place]. She called her worker([place]) and left message to return her call. She has not yet heard from worker. I called the DSS worker to find out the status of her Medicaid while I had her in my office and had to leave message as well for her to return the patient's call to give permission to speak with me and explain to patient what happened with her Medicaid. I also left my contact information. Patient also stated her handicap placard had ripped and wanted so see about getting another one. I researched on the website and gave her the form that needed to be completed as well as the number to [facility] to find out if the same physician needed to sign or if Dr.Livesay could complete. Also gave patient a referral form for [employer]. Approved patient for [place]. Patient has copy of award as well as expenses it covers along with pharmacy contact information. Emailed [place] to see if additional funds are available for [employer]. Patient has my card for any additional questions or concerns. Escorted patient to conference room for class.

## 2015-06-23 NOTE — Telephone Encounter (Signed)
Gave patient avs report and appointments for March. Per 2/27 pof cxd CDDP tx and patient to have 1st long taxol/carbo 3/6 - iron inf this week and again 3/13 w/LL.

## 2015-06-23 NOTE — Progress Notes (Signed)
OFFICE PROGRESS NOTE   June 25, 2015   Physicians: Everitt Amber, Carolyne Fiscal  INTERVAL HISTORY:  Patient is seen, alone for visit tho brought to office by niece, for further discussion of treatment for IIIB serous endometrial carcinoma. The malignancy has cervical and upper left vaginal forniceal involvement, as well as bulky mass posterior to uterus by imaging. Patient cancelled at least 2 CT simulation appointments since I saw her initially on 06-06-15, such that no treatment has yet been started. Patient mentioned financial and other concerns as reasons for cancelling appointments, with multiple [place] support staff meeting with her also today. Case was presented at gyn oncology multidisciplinary conference 06-23-15, with recommendation based on recent imaging review to proceed with initial carboplatin taxol chemotherapy x 3-4 cycles, then consider additional chemotherapy vs surgery, possibly with radiation at completion if good response.   Patient continues to have intermittent light vaginal bleeding, using 3 pads in last 24 hours not soaked. She had loose stools x 3 on 2-25 and 2-26 which she thought was related to eating lots of vegetables, not watery, no vomiting, and no bowel movement today. She denies nausea, is eating. Bladder function seems ok. No other bleeding. Is fatigued and exercise tolerance not good tho not worse from what she has had for months. No new or different pain. No cough or chest pain.  Remainder of 10 point Review of Systems negative/ unchanged.  Patient tells me that niece is not aware of present diagnosis. She tells me that her best support is from her sons, tho I am not clear that she has shared this information with sons either.  She did begin OTC iron sulfate due to problems with coverage for ferrous fumarate. I believe the ferrous fumarate is now available.   No central catheter No genetics testing Refused flu vaccine    ONCOLOGIC HISTORY Patient  presented in early Dec 2016 with intermittent postmenopausal bleeding x 1-2 years; last PAP >10 years prior. Dr Mickle Mallory obtained PAP 03-31-15 with HGSIL and [place] with thickened endometrium and intrauterine mass. 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 She saw Dr Denman George on 05-30-15, with exam noting encroachment of tumor onto the left anterior vaginal fornix, cervix 5 cm, friable, grossly replaced with tumor, uterus bulky with enlarged lower uterine segment and no apparent parametrial extension. Initial recommendation was for staging CT CAP and likely for initial chemoradiation + brachytherapy, then, if responding well to treatment, to have extrafascial hysterectomy 6 weeks out from completion of radiation, followed by additional 4 cycles of adjuvant carboplatin taxol. CT CAP 06-03-2015 showed no metastatic disease in chest or other organs, a 7.4 x 4.0 x 6.3 cm mass involving uterine corpus and cervix, and abutting anterior wall of rectum, some adenopathy retroperitoneal/ bilateral iliac, right inguinal regions. She saw Dr Sondra Come in consultation on 06-04-15, however subsequently cancelled simulation at least x2. Case was presented at gyn oncology multidisciplinary conference with recommendation to proceed with initial carboplatin taxol chemotherapy x 3-4 cycles, then consider additional chemotherapy vs surgery, possibly with radiation at completion if good response.    Objective:  Vital signs in last 24 hours:  BP 111/58 mmHg  Pulse 85  Temp(Src) 99.1 F (37.3 C) (Oral)  Resp 18  Ht '5\' 1"'$  (1.549 m)  Wt 166 lb 6.4 oz (75.479 kg)  BMI 31.46 kg/m2  SpO2 100% Weight down 2 lbs Alert, oriented and appropriate. Ambulatory tho uses WC for office No alopecia  HEENT:PERRL, sclerae not icteric. Oral mucosa  moist without lesions, posterior pharynx clear. Mucous membranes pale. Neck supple. No JVD.  Lymphatics:no cervical,supraclavicular adenopathy Resp: clear to auscultation bilaterally and normal percussion bilaterally Cardio: regular rate and rhythm. No gallop. GI: soft, nontender, not distended. Normally active bowel sounds. Musculoskeletal/ Extremities: LE without pitting edema, cords, tenderness. Chronic orthopedic problems LE Neuro: no peripheral neuropathy. Otherwise nonfocal. PSYCH appropriate mood and affect, interacts well Skin without rash, ecchymosis, petechiae. Nailbeds pale   Lab Results:  Results for orders placed or performed in visit on 06/23/15  CBC with Differential  Result Value Ref Range   WBC 5.0 3.9 - 10.3 10e3/uL   NEUT# 3.5 1.5 - 6.5 10e3/uL   HGB 9.8 (L) 11.6 - 15.9 g/dL   HCT 30.8 (L) 34.8 - 46.6 %   Platelets 290 145 - 400 10e3/uL   MCV 74.4 (L) 79.5 - 101.0 fL   MCH 23.5 (L) 25.1 - 34.0 pg   MCHC 31.6 31.5 - 36.0 g/dL   RBC 4.15 3.70 - 5.45 10e6/uL   RDW 15.5 (H) 11.2 - 14.5 %   lymph# 0.8 (L) 0.9 - 3.3 10e3/uL   MONO# 0.5 0.1 - 0.9 10e3/uL   Eosinophils Absolute 0.1 0.0 - 0.5 10e3/uL   Basophils Absolute 0.0 0.0 - 0.1 10e3/uL   NEUT% 71.2 38.4 - 76.8 %   LYMPH% 16.3 14.0 - 49.7 %   MONO% 9.7 0.0 - 14.0 %   EOS% 2.2 0.0 - 7.0 %   BASO% 0.6 0.0 - 2.0 %  Comprehensive metabolic panel  Result Value Ref Range   Sodium 142 136 - 145 mEq/L   Potassium 4.1 3.5 - 5.1 mEq/L   Chloride 107 98 - 109 mEq/L   CO2 26 22 - 29 mEq/L   Glucose 95 70 - 140 mg/dl   BUN 8.7 7.0 - 26.0 mg/dL   Creatinine 0.7 0.6 - 1.1 mg/dL   Total Bilirubin <0.30 0.20 - 1.20 mg/dL   Alkaline Phosphatase 76 40 - 150 U/L   AST 10 5 - 34 U/L   ALT <9 0 - 55 U/L   Total Protein 7.9 6.4 - 8.3 g/dL   Albumin 3.3 (L) 3.5 - 5.0 g/dL   Calcium 9.2 8.4 - 10.4 mg/dL   Anion Gap 10 3 - 11 mEq/L   EGFR >90 >90 ml/min/1.73 m2     Studies/Results:  No results found.  Medications: I  have reviewed the patient's current medications.  RN has been in touch with pharmacy to follow up ativan and zofran prescriptions, decadron escribed. Recommended claritin daily x 7 begin day of chemo, to help with taxol aches. Iron daily on empty stomach with OJ or Vit C tablet for now, but will make arrangements for IV iron x 2 to replete iron stores much more quickly.   DISCUSSION  I have talked with patient about recommendation now from gyn oncology multidisciplinary conference that we begin treatment with chemotherapy, using carboplatin and taxol every 3 weeks. I have shown her CT PACs images to clarify our discussion; I have told her clearly that this problem will not resolve without treatment and that she will have progressive problems from the cancer if no intervention. In addition to information that I have given her about these chemotherapy agents, she has also had separate teaching by chemo education RN again today ( as initial teaching  had been for CDDP). I have explained use of premedication steroids to decrease chance of allergic reaction to taxol, possible taxol aches and possible peripheral neuropathy. We have gone over usual outpatient administration and follow up between treatments. RN will speak with her by phone prior to first chemo to review antiemetics and times for decadron  Patient is very iron deficient related to 1-2 years of vaginal bleeding. She would like IV iron, which I have requested as feraheme or equivalent.We will try to give one iron infusion this week and the second when I see her after first chemo (not day of chemo).  I have reminded her that she can call at any time if questions or concerns, and that we will follow her closely thru chemo. I have encouraged her to let sons know situation, in case they need to assist. Patient tells me that she feels much more comfortable with plans now and has given verbal consent for chemotherapy. We will begin on 06-30-15 in time  already scheduled for CDDP as long as preauthorization obtained.   Patient was also seen by [facility] SW and financial counselor today. Patient seen back by MD after chemotherapy education nurse completed teaching.  Communication with gyn oncology staff directly.  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 Plan to start chemo on 06-30-15. Appreciate all assistance from nursing and support staff. 2.iron deficiency anemia: serum iron 18 and %sat 8 on 06-06-15. Now on oral iron, continues to bleed. Will give feraheme x 2 starting this week if possible, then with my visit after chemo. 3.sickle cell trait 4.flu vaccine refused 5.up to date mammograms and colonoscopy 6.chronic orthopedic problems legs 7.past tobacco 8.BTL, tonsillectomy 9.Advance Directives in place 10.social concerns: financial and other support. As above  Time spent >40 min including >50% counseling and coordination of care. Chemo orders and feraheme orders placed, granix requested to be used if needed. Managed care notified for preauth.  I will see her back with labs ~ 1 week and ~ 2 weeks after chemo. CC Drs Denman George, Kinard, Tivis Ringer, MD   06/25/2015, 8:22 AM

## 2015-06-23 NOTE — Progress Notes (Signed)
Gynecologic Oncology Multi-Disciplinary Disposition Conference Note  Date of the Conference: June 23, 2015  Patient Name: Kristen Greene  Referring Provider: Dr. Terri Piedra Primary GYN Oncologist: Dr. 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 Plan for 4 cycles of carboplatin and taxol followed by re-imaging CT CAP then for consideration of additional chemotherapy or surgery followed by chemotherapy.  Plan for radiation after completion of chemotherapy if responsive to chemo.     This Multidisciplinary conference took place involving physicians from [place], [place], Radiation Oncology, Pathology, Radiology along with the Gynecologic Oncology Nurse Practitioner and RN.  Comprehensive assessment of the patient's malignancy, staging, need for surgery, chemotherapy, radiation therapy, and need for further testing were reviewed. Supportive measures, both inpatient and following discharge were also discussed. The recommended plan of care is documented. Greater than 35 minutes were spent correlating and coordinating this patient's care.

## 2015-06-24 ENCOUNTER — Telehealth: Payer: Self-pay

## 2015-06-24 DIAGNOSIS — C541 Malignant neoplasm of endometrium: Secondary | ICD-10-CM

## 2015-06-24 MED ORDER — LORATADINE 10 MG PO TABS: 10.0000 mg | ORAL_TABLET | Freq: Every day | ORAL | Status: DC

## 2015-06-24 NOTE — Telephone Encounter (Signed)
-----   Message from Gordy Levan, MD sent at 06/24/2015  7:21 AM EST ----- 1. Script for decadron 4 mg   Five tabs with food 12 hrs and 6 hrs prior to taxol chemotherapy   #10 for first treatment. Tell pharmacist she should pick up claritin also.  First chemo expected 3-6. She has been poorly compliant with medical instructions so far. Note financial counselors helping. She is  to be at office on 3-2 for iron.  2. RN please speak to her 3-2 or 3-3 to be sure she has gotten decadron, zofran, ativan (or generics).  Give her written and oral instructions for the decadron, including times based on time of first chemo.  Suggest she take lorazepam night of first chemo whether or not any nausea, and zofran AM after first chemo whether or not nausea, otherwise as instructed.  Remind her to take claritin daily x 7 begin day of chemo, for taxol aches.  REMIND HER THAT SHE MAY HAVE ACHES FROM TAXOL   Thank you

## 2015-06-24 NOTE — Telephone Encounter (Signed)
Called [facility] to make sure Rxs are ready. Called pt to let her know zofran, ativan and decadron are ready for pickup. The tech at [facility] said they would be about $3 total. The pt will ask the RN in infusion Thursday how to take her meds for chemo. Also I will write up instructions for her.

## 2015-06-24 NOTE — Telephone Encounter (Signed)
rx for claritin per Dr Edwyna Shell note

## 2015-06-25 ENCOUNTER — Encounter: Payer: Self-pay | Admitting: Oncology

## 2015-06-25 DIAGNOSIS — Z91199 Patient's noncompliance with other medical treatment and regimen due to unspecified reason: Secondary | ICD-10-CM | POA: Insufficient documentation

## 2015-06-25 DIAGNOSIS — N950 Postmenopausal bleeding: Secondary | ICD-10-CM | POA: Insufficient documentation

## 2015-06-25 DIAGNOSIS — Z9111 Patient's noncompliance with dietary regimen: Secondary | ICD-10-CM | POA: Insufficient documentation

## 2015-06-25 NOTE — Progress Notes (Signed)
Received email from Adonis Huguenin that funds are available for [employer]. Patient approved for funds in the amount of $400.

## 2015-06-25 NOTE — Progress Notes (Signed)
Patient left me a voicemail stating she finally spoke with a caseworker at [place] for Medicaid who told her Medicaid was terminated due to missing information and has to start over again with the process. She states she did turn everything in and will try to go to DSS tomorrow to get this taken care of.

## 2015-06-25 NOTE — Progress Notes (Signed)
Called patient to inform she was approved for [employer]. Patient still has not heard from [facility] worker neither have I. We agreed to inform each other if we hear from her. Patient also had questions about her Social Security and [employer] no longer being paid, referred patient back to the Social Security office for assistance with these questions. Patient has my contact information for any additional questions or concerns.

## 2015-06-26 ENCOUNTER — Other Ambulatory Visit: Payer: Self-pay | Admitting: Oncology

## 2015-06-26 ENCOUNTER — Ambulatory Visit (HOSPITAL_BASED_OUTPATIENT_CLINIC_OR_DEPARTMENT_OTHER): Payer: PPO

## 2015-06-26 ENCOUNTER — Encounter: Payer: Self-pay | Admitting: *Deleted

## 2015-06-26 VITALS — BP 138/85 | HR 98 | Temp 98.2°F | Resp 18

## 2015-06-26 DIAGNOSIS — C541 Malignant neoplasm of endometrium: Secondary | ICD-10-CM

## 2015-06-26 DIAGNOSIS — D509 Iron deficiency anemia, unspecified: Secondary | ICD-10-CM | POA: Diagnosis not present

## 2015-06-26 MED ORDER — SODIUM CHLORIDE 0.9 % IV SOLN
510.0000 mg | Freq: Once | INTRAVENOUS | Status: AC
  Administered 2015-06-26: 510 mg via INTRAVENOUS
  Filled 2015-06-26: qty 17

## 2015-06-26 NOTE — Patient Instructions (Signed)

## 2015-06-26 NOTE — Progress Notes (Signed)
Late Note: 06/23/15 [facility] Psychosocial Distress Screening Clinical Social Work  Clinical Social Work was referred by medical oncologist and distress screening protocol.  The patient scored a 6 on the Psychosocial Distress Thermometer which indicates moderate distress. Clinical Social Worker met with patient after medical oncologist in exam room to assess for distress and other psychosocial needs. Kristen Greene was alone for today's visit- stated her niece brought her today.  Kristen Greene indicated her greatest cause of distress was fear regarding side effects of the treatment.  The patient shared she met with Dr. Marko Plume today and feels much more comfortable with plan of chemotherapy only.  CSW encouraged patient to reach out to CSW if she feels overwhelmed or anxious again, so that we can support her through this process.  Kristen Greene checked insurance is a cause of distress and acknowledged she is working on this with Kristen Greene, financial advocate.  Patient believes she can receive transportation from her niece or church friends.  As a back up option, CSW provided information on [facility] and [employer] transportation.  CSW encouraged patient to attend GYN cancer support group to connect and receive support from other cancer survivors.  Kristen Greene shared she receives the majority of support from her faith in 68 and church congregation.   ONCBCN DISTRESS SCREENING 06/23/2015  Screening Type Initial Screening  Distress experienced in past week (1-10) 6  Practical problem type Insurance;Transportation  Family Problem type Other (comment)  Emotional problem type Adjusting to illness  Spiritual/Religous concerns type (No Data)  Information Concerns Type (No Data)  Physical Problem type (No Data)  Physician notified of physical symptoms   Referral to clinical social work Yes    Kristen Greene, MSW, LCSW, OSW-C Clinical Social Worker [place] [phone]

## 2015-06-27 ENCOUNTER — Telehealth: Payer: Self-pay

## 2015-06-27 NOTE — Telephone Encounter (Signed)
Appointments for 3/6 cancelled. Message routed to [facility] per Dr Edwyna Shell

## 2015-06-27 NOTE — Telephone Encounter (Signed)
Re patient cancelling chemo on 3-6 Please let infusion know Please let Polo Riley know  Hopefully she will keep my apt on 3-13 Thank you Lennis
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
## 2015-06-30 ENCOUNTER — Ambulatory Visit: Payer: PPO

## 2015-06-30 ENCOUNTER — Other Ambulatory Visit: Payer: PPO

## 2015-07-01 ENCOUNTER — Encounter: Payer: Self-pay | Admitting: *Deleted

## 2015-07-01 NOTE — Progress Notes (Unsigned)
Kristen Greene  Clinical Social Greene contacted patient by phone after patient missed chemotherapy appointment this week.  Patient was receptive to talking with CSW, but would not elaborate on why she missed chemotherapy appointment this week.  Kristen Greene reported she is not scared or anxious of treatment.  CSW discussed common feelings associated with the cancer experience and normalized feelings of anxiety, fear, and sadness and discussed positive coping skills.  CSW encouraged patient to follow up with CSW if she needs additional support or resources.   Polo Riley, MSW, LCSW, OSW-C Clinical Social Worker [facility] [place] [phone]

## 2015-07-05 ENCOUNTER — Other Ambulatory Visit: Payer: Self-pay | Admitting: Oncology

## 2015-07-05 DIAGNOSIS — C541 Malignant neoplasm of endometrium: Secondary | ICD-10-CM

## 2015-07-07 ENCOUNTER — Ambulatory Visit (HOSPITAL_BASED_OUTPATIENT_CLINIC_OR_DEPARTMENT_OTHER): Payer: PPO

## 2015-07-07 ENCOUNTER — Other Ambulatory Visit (HOSPITAL_BASED_OUTPATIENT_CLINIC_OR_DEPARTMENT_OTHER): Payer: PPO

## 2015-07-07 ENCOUNTER — Encounter: Payer: Self-pay | Admitting: Oncology

## 2015-07-07 ENCOUNTER — Ambulatory Visit (HOSPITAL_BASED_OUTPATIENT_CLINIC_OR_DEPARTMENT_OTHER): Payer: PPO | Admitting: Oncology

## 2015-07-07 ENCOUNTER — Other Ambulatory Visit: Payer: PPO

## 2015-07-07 ENCOUNTER — Telehealth: Payer: Self-pay | Admitting: Oncology

## 2015-07-07 VITALS — BP 149/80 | HR 75 | Temp 98.1°F | Resp 18

## 2015-07-07 VITALS — BP 118/75 | HR 81 | Temp 98.5°F | Resp 18 | Ht 61.0 in | Wt 164.1 lb

## 2015-07-07 DIAGNOSIS — C541 Malignant neoplasm of endometrium: Secondary | ICD-10-CM

## 2015-07-07 DIAGNOSIS — D500 Iron deficiency anemia secondary to blood loss (chronic): Secondary | ICD-10-CM

## 2015-07-07 DIAGNOSIS — D509 Iron deficiency anemia, unspecified: Secondary | ICD-10-CM

## 2015-07-07 DIAGNOSIS — D573 Sickle-cell trait: Secondary | ICD-10-CM

## 2015-07-07 DIAGNOSIS — Z91199 Patient's noncompliance with other medical treatment and regimen due to unspecified reason: Secondary | ICD-10-CM

## 2015-07-07 DIAGNOSIS — Z9111 Patient's noncompliance with dietary regimen: Secondary | ICD-10-CM

## 2015-07-07 LAB — COMPREHENSIVE METABOLIC PANEL
AST: 10 U/L (ref 5–34)
Albumin: 3.1 g/dL — ABNORMAL LOW (ref 3.5–5.0)
Alkaline Phosphatase: 79 U/L (ref 40–150)
Anion Gap: 10 mEq/L (ref 3–11)
BILIRUBIN TOTAL: 0.35 mg/dL (ref 0.20–1.20)
BUN: 10.5 mg/dL (ref 7.0–26.0)
CALCIUM: 9.2 mg/dL (ref 8.4–10.4)
CHLORIDE: 108 meq/L (ref 98–109)
CO2: 26 mEq/L (ref 22–29)
CREATININE: 0.7 mg/dL (ref 0.6–1.1)
EGFR: >90 mL/min/{1.73_m2} (ref >90)
Glucose: 113 mg/dl (ref 70–140)
Potassium: 3.6 mEq/L (ref 3.5–5.1)
Sodium: 143 mEq/L (ref 136–145)
TOTAL PROTEIN: 7.7 g/dL (ref 6.4–8.3)

## 2015-07-07 LAB — CBC WITH DIFFERENTIAL/PLATELET
BASO%: 0.7 % (ref 0.0–2.0)
Basophils Absolute: 0 10*3/uL (ref 0.0–0.1)
EOS%: 1.6 % (ref 0.0–7.0)
Eosinophils Absolute: 0.1 10*3/uL (ref 0.0–0.5)
HEMATOCRIT: 31.7 % — AB (ref 34.8–46.6)
HEMOGLOBIN: 9.9 g/dL — AB (ref 11.6–15.9)
LYMPH#: 0.8 10*3/uL — AB (ref 0.9–3.3)
LYMPH%: 16.9 % (ref 14.0–49.7)
MCH: 23.5 pg — ABNORMAL LOW (ref 25.1–34.0)
MCHC: 31.3 g/dL — AB (ref 31.5–36.0)
MCV: 75.2 fL — ABNORMAL LOW (ref 79.5–101.0)
MONO#: 0.5 10*3/uL (ref 0.1–0.9)
MONO%: 11 % (ref 0.0–14.0)
NEUT%: 69.8 % (ref 38.4–76.8)
NEUTROS ABS: 3.4 10*3/uL (ref 1.5–6.5)
Platelets: 279 10*3/uL (ref 145–400)
RBC: 4.21 10*6/uL (ref 3.70–5.45)
RDW: 16.5 % — AB (ref 11.2–14.5)
WBC: 4.9 10*3/uL (ref 3.9–10.3)

## 2015-07-07 MED ORDER — METHYLPREDNISOLONE SODIUM SUCC 125 MG IJ SOLR
125.0000 mg | Freq: Once | INTRAMUSCULAR | Status: AC
  Administered 2015-07-07: 125 mg via INTRAVENOUS

## 2015-07-07 MED ORDER — SODIUM CHLORIDE 0.9 % IV SOLN
Freq: Once | INTRAVENOUS | Status: AC
  Administered 2015-07-07: 10:00:00 via INTRAVENOUS

## 2015-07-07 MED ORDER — SODIUM CHLORIDE 0.9 % IV SOLN
510.0000 mg | Freq: Once | INTRAVENOUS | Status: AC
  Administered 2015-07-07: 510 mg via INTRAVENOUS
  Filled 2015-07-07: qty 17

## 2015-07-07 NOTE — Telephone Encounter (Signed)
appt made and ave will be given to patient in treatment room

## 2015-07-07 NOTE — Patient Instructions (Signed)

## 2015-07-07 NOTE — Progress Notes (Signed)
0937: Pt coughing, eyes red, reports she feels "weird". Also reported dyspnea and tightness in chest. Feraheme infusion stopped, normal saline started wide open. Pt reported symptoms were resolving after stopping med.  Dr. Marko Plume made aware. Order received to discontinue Feraheme, administer Solu-Medrol only. Give Benadryl if symptoms persist.  Per Dr. Marko Plume, pt instructed to continue oral iron at home, will not re-challenge with Feraheme.

## 2015-07-07 NOTE — Progress Notes (Signed)
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 Patient has been very reluctant to begin any treatment, including FTKA first planned chemotherapy on 06-30-15. She did receive IV iron on 06-26-15, tolerated well. 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  Patient tells me now that she has personal issues that must be resolved before she can start treatment, particularly care of 13 year old great grandson who lives with her. She is arranging for him to finish school in another county, with temporary custody.  Patient tells me that she feels entirely well. She reports very minimal vaginal bleeding. Appetite is good, no SOB, no pain, bowels moving well regularly, no LE swelling, no fever or symptoms of infection, no other bleeding.  Remainder of 10 point Review of Systems negative/ unchanged.   No central catheter No genetics testing Refused flu vaccine Feraheme on 06-26-15  ONCOLOGIC HISTORY Patient presented in early Dec 2016 with intermittent postmenopausal bleeding x 1-2 years; last PAP >10 years prior. Dr Mickle Mallory obtained PAP 03-31-15 with HGSIL and [place] with thickened endometrium and intrauterine mass. 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 She saw Dr Denman George on 05-30-15, with exam noting encroachment of tumor onto the left anterior vaginal fornix, cervix 5 cm, friable, grossly replaced with tumor, uterus bulky with enlarged lower uterine segment and no apparent parametrial extension. Initial recommendation was for staging CT CAP and likely for initial chemoradiation +  brachytherapy, then, if responding well to treatment, to have extrafascial hysterectomy 6 weeks out from completion of radiation, followed by additional 4 cycles of adjuvant carboplatin taxol. CT CAP 06-03-2015 showed no metastatic disease in chest or other organs, a 7.4 x 4.0 x 6.3 cm mass involving uterine corpus and cervix, and abutting anterior wall of rectum, some adenopathy retroperitoneal/ bilateral iliac, right inguinal regions. She saw Dr Sondra Come in consultation on 06-04-15, however subsequently cancelled simulation at least x2. Case was presented at gyn oncology multidisciplinary conference with recommendation to proceed with initial carboplatin taxol chemotherapy x 3-4 cycles, then consider additional chemotherapy vs surgery, possibly with radiation at completion if good response.  As of 07-07-15, patient has not agreed to start treatment.  She had first feraheme on 06-26-15 without difficulty, then reaction shortly after start of second dose on 07-07-15.   Objective:  Vital signs in last 24 hours:  BP 118/75 mmHg  Pulse 81  Temp(Src) 98.5 F (36.9 C) (Oral)  Resp 18  Ht 5\' 1"  (1.549 m)  Wt 164 lb 1.6 oz (74.435 kg)  BMI 31.02 kg/m2  SpO2 100% Weight down 2 lbs Alert, oriented and appropriate, cooperative, seems to follow discussion. Using WC for office due to chronic orthopedic problems LE   HEENT:PERRL, sclerae not icteric. Oral mucosa moist without lesions, posterior pharynx clear.  No JVD.  Lymphatics:no cervical,suraclavicular adenopathy Resp: clear to auscultation bilaterally and normal percussion bilaterally Cardio: regular rate and rhythm. No gallop. GI: soft, nontender, not distended, no mass or organomegaly. Normally active bowel sounds. Musculoskeletal/ Extremities: without pitting edema, cords, tenderness Neuro: speech fluent, moves all extremities in WC, otherwise nonfocal on exam in WC. Skin without rash, ecchymosis, petechiae   Lab  Results:  Results for orders  placed or performed in visit on 07/07/15  CBC with Differential  Result Value Ref Range   WBC 4.9 3.9 - 10.3 10e3/uL   NEUT# 3.4 1.5 - 6.5 10e3/uL   HGB 9.9 (L) 11.6 - 15.9 g/dL   HCT 31.7 (L) 34.8 - 46.6 %   Platelets 279 145 - 400 10e3/uL   MCV 75.2 (L) 79.5 - 101.0 fL   MCH 23.5 (L) 25.1 - 34.0 pg   MCHC 31.3 (L) 31.5 - 36.0 g/dL   RBC 4.21 3.70 - 5.45 10e6/uL   RDW 16.5 (H) 11.2 - 14.5 %   lymph# 0.8 (L) 0.9 - 3.3 10e3/uL   MONO# 0.5 0.1 - 0.9 10e3/uL   Eosinophils Absolute 0.1 0.0 - 0.5 10e3/uL   Basophils Absolute 0.0 0.0 - 0.1 10e3/uL   NEUT% 69.8 38.4 - 76.8 %   LYMPH% 16.9 14.0 - 49.7 %   MONO% 11.0 0.0 - 14.0 %   EOS% 1.6 0.0 - 7.0 %   BASO% 0.7 0.0 - 2.0 %     Studies/Results:  No results found.  Medications: I have reviewed the patient's current medications.  DISCUSSION I have reviewed concerns about delay in treatment of this high grade, locally advanced gyn carcinoma. Fortunately she is not having significant symptoms, tho these are to be expected if we do not get effective intervention for this problem. Social concerns are understandable, hopefully to be resolved at least in short term in next week or so. Patient agrees with second IV iron infusion today as planned, but refuses start of any treatment yet. She agrees with scheduling a follow up appointment with this MD in early April.  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 Plan to start chemo on 06-30-15. Appreciate all assistance from nursing and support staff. 2.iron deficiency anemia: serum iron 18 and %sat 8 on 06-06-15. Now on oral iron, continues to bleed. No problems with first IV feraheme on 06-26-15, but reaction 5 min into second dose given after MD visit today, that resolved with usual interventions and will not try further feraheme. 3.sickle cell trait 4.flu vaccine refused 5.up to date mammograms and colonoscopy 6.chronic orthopedic problems legs 7.past tobacco 8.BTL, tonsillectomy 9.Advance Directives in place 10.social concerns: financial and other support. As above   All questions answered and patient seems in agreement with recommendations and plans, knows that she can call if needed prior to next scheduled appointment. Time spent 25 min including >50% counseling and coordination of care. Cc Drs Denman George and Sondra Come as update   Gordy Levan, MD   07/07/2015, 8:43 AM

## 2015-07-11 ENCOUNTER — Encounter: Payer: Self-pay | Admitting: *Deleted

## 2015-07-11 NOTE — Progress Notes (Signed)
[place] Work  Clinical Social Work received phone call from patient requesting assistance with a letter for great grandson's school.  Ms. Draft has custody of her great grandson, but does not feel she is able to care for him while in treatment.  The patient has identified her son and son's girlfriend to provide temporary care. However, patient's great grandson will need to transfer schools and patient needs letter from [place] stating patient is in active treatment.  CSW drafted letter and left at [facility] front desk for patient to pick up.  Polo Riley, MSW, LCSW, OSW-C Clinical Social Worker [facility] [phone]

## 2015-07-14 ENCOUNTER — Ambulatory Visit: Payer: PPO

## 2015-07-14 ENCOUNTER — Other Ambulatory Visit: Payer: PPO

## 2015-07-16 ENCOUNTER — Telehealth: Payer: Self-pay

## 2015-07-16 NOTE — Telephone Encounter (Signed)
S/w pt re taking her iron. Her OTC is ferrous sulfate 124 mg /tab. she does have the ferocyte 325 mg/ tab. Discussed taking ferocyte and seeing if she feels any different than taking the OTC iron. And if she does take the OTC iron to take 3 tabs to be in the correct mg range. She says she has all the paperwork for her great grandson and is waiting for spring break to be over and start him in the new school.  She was able to tell me her next appts.

## 2015-07-16 NOTE — Telephone Encounter (Signed)
-----   Message from Gordy Levan, MD sent at 07/13/2015 12:21 PM EDT ----- On 3-21 or 3-22 RN please check on patient by phone. Had reaction to second dose feraheme on 3-13. Please encourage her to continue oral iron since we were not able to give that dose - ferrous fumarate if she was able to get that, otherwise ferrous sulfate.  Polo Riley was helping with letter for great grandson's change of school. Please remind her of next apts thanks

## 2015-07-19 LAB — HM DIABETES EYE EXAM

## 2015-07-27 ENCOUNTER — Other Ambulatory Visit: Payer: Self-pay | Admitting: Oncology

## 2015-07-27 DIAGNOSIS — C541 Malignant neoplasm of endometrium: Secondary | ICD-10-CM

## 2015-07-31 ENCOUNTER — Other Ambulatory Visit (HOSPITAL_BASED_OUTPATIENT_CLINIC_OR_DEPARTMENT_OTHER): Payer: PPO

## 2015-07-31 ENCOUNTER — Encounter: Payer: Self-pay | Admitting: Oncology

## 2015-07-31 ENCOUNTER — Ambulatory Visit (HOSPITAL_BASED_OUTPATIENT_CLINIC_OR_DEPARTMENT_OTHER): Payer: PPO | Admitting: Oncology

## 2015-07-31 ENCOUNTER — Telehealth: Payer: Self-pay | Admitting: Oncology

## 2015-07-31 VITALS — BP 134/81 | HR 83 | Temp 98.5°F | Resp 18 | Ht 61.0 in | Wt 160.2 lb

## 2015-07-31 DIAGNOSIS — D573 Sickle-cell trait: Secondary | ICD-10-CM | POA: Diagnosis not present

## 2015-07-31 DIAGNOSIS — D509 Iron deficiency anemia, unspecified: Secondary | ICD-10-CM | POA: Diagnosis not present

## 2015-07-31 DIAGNOSIS — D500 Iron deficiency anemia secondary to blood loss (chronic): Secondary | ICD-10-CM

## 2015-07-31 DIAGNOSIS — C541 Malignant neoplasm of endometrium: Secondary | ICD-10-CM

## 2015-07-31 DIAGNOSIS — Z91199 Patient's noncompliance with other medical treatment and regimen due to unspecified reason: Secondary | ICD-10-CM

## 2015-07-31 DIAGNOSIS — Z9119 Patient's noncompliance with other medical treatment and regimen: Secondary | ICD-10-CM

## 2015-07-31 DIAGNOSIS — N939 Abnormal uterine and vaginal bleeding, unspecified: Secondary | ICD-10-CM

## 2015-07-31 LAB — CBC WITH DIFFERENTIAL/PLATELET
BASO%: 0.7 % (ref 0.0–2.0)
BASOS ABS: 0 10*3/uL (ref 0.0–0.1)
EOS%: 3.3 % (ref 0.0–7.0)
Eosinophils Absolute: 0.1 10*3/uL (ref 0.0–0.5)
HEMATOCRIT: 30.3 % — AB (ref 34.8–46.6)
HEMOGLOBIN: 9.8 g/dL — AB (ref 11.6–15.9)
LYMPH#: 0.7 10*3/uL — AB (ref 0.9–3.3)
LYMPH%: 16.5 % (ref 14.0–49.7)
MCH: 24.3 pg — ABNORMAL LOW (ref 25.1–34.0)
MCHC: 32.4 g/dL (ref 31.5–36.0)
MCV: 74.8 fL — ABNORMAL LOW (ref 79.5–101.0)
MONO#: 0.4 10*3/uL (ref 0.1–0.9)
MONO%: 9.4 % (ref 0.0–14.0)
NEUT%: 70.1 % (ref 38.4–76.8)
NEUTROS ABS: 2.8 10*3/uL (ref 1.5–6.5)
Platelets: 282 10*3/uL (ref 145–400)
RBC: 4.05 10*6/uL (ref 3.70–5.45)
RDW: 17.2 % — AB (ref 11.2–14.5)
WBC: 4.1 10*3/uL (ref 3.9–10.3)

## 2015-07-31 LAB — COMPREHENSIVE METABOLIC PANEL
ALBUMIN: 3 g/dL — AB (ref 3.5–5.0)
ALK PHOS: 68 U/L (ref 40–150)
AST: 10 U/L (ref 5–34)
Anion Gap: 9 mEq/L (ref 3–11)
BUN: 6.4 mg/dL — AB (ref 7.0–26.0)
CALCIUM: 9.1 mg/dL (ref 8.4–10.4)
CO2: 27 mEq/L (ref 22–29)
CREATININE: 0.7 mg/dL (ref 0.6–1.1)
Chloride: 108 mEq/L (ref 98–109)
EGFR: >90 mL/min/{1.73_m2} (ref >90)
Glucose: 106 mg/dl (ref 70–140)
Potassium: 3.6 mEq/L (ref 3.5–5.1)
Sodium: 144 mEq/L (ref 136–145)
TOTAL PROTEIN: 7.4 g/dL (ref 6.4–8.3)
Total Bilirubin: <0.3 mg/dL (ref 0.20–1.20)

## 2015-07-31 NOTE — Progress Notes (Signed)
OFFICE PROGRESS NOTE   July 31, 2015   Physicians: Everitt Amber, Carolyne Fiscal  INTERVAL HISTORY:  Patient is seen, alone for visit, still not having begun any treatment for locally advanced high grade serous endometrial carcinoma, first consultation with medical oncology 06-06-15. Recommendation is for carboplatin taxol as initial intervention, tho she would need radiation if heavy vaginal bleeding.  Patient has declined start of treatment until arrangements in place for care of her great grandson.    Patient reports intermittently more heavy vaginal bleeding, soaking ~ 4 pads yesterday, less heavy today. She denies abdominal or pelvic pain, nausea or vomiting, other bleeding, LE swelling. She is having regular bowel movements, unchanged. She denies increased SOB, fever or symptoms of infection. Appetite is excellent. She was able to sleep last pm.  She had reaction to second IV feraheme on 07-07-15, that DCd.  She has been taking OTC iron tid with meals. Discussed better absorption on empty stomach with OJ or Vit C tablet.  Remainder of 10 point Review of Systems negative    No central catheter No genetics testing Refused flu vaccine Feraheme on 06-26-15  ONCOLOGIC HISTORY Patient presented in early Dec 2016 with intermittent postmenopausal bleeding x 1-2 years; last PAP >10 years prior. Dr Mickle Mallory obtained PAP 03-31-15 with HGSIL and [place] with thickened endometrium and intrauterine mass. 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 She saw Dr Denman George on 05-30-15, with exam noting encroachment of tumor onto the left anterior vaginal fornix, cervix 5 cm, friable, grossly replaced with tumor, uterus bulky with enlarged lower uterine segment and no apparent parametrial extension. Initial  recommendation was for staging CT CAP and likely for initial chemoradiation + brachytherapy, then, if responding well to treatment, to have extrafascial hysterectomy 6 weeks out from completion of radiation, followed by additional 4 cycles of adjuvant carboplatin taxol. CT CAP 06-03-2015 showed no metastatic disease in chest or other organs, a 7.4 x 4.0 x 6.3 cm mass involving uterine corpus and cervix, and abutting anterior wall of rectum, some adenopathy retroperitoneal/ bilateral iliac, right inguinal regions. She saw Dr Sondra Come in consultation on 06-04-15, however subsequently cancelled simulation at least x2. Case was presented at gyn oncology multidisciplinary conference with recommendation to proceed with initial carboplatin taxol chemotherapy x 3-4 cycles, then consider additional chemotherapy vs surgery, possibly with radiation at completion if good response.  As of 07-07-15, patient has not agreed to start treatment.  She had first feraheme on 06-26-15 without difficulty, then reaction shortly after start of second dose on 07-07-15.    Objective:  Vital signs in last 24 hours:  BP 134/81 mmHg  Pulse 83  Temp(Src) 98.5 F (36.9 C) (Oral)  Resp 18  Ht 5\' 1"  (1.549 m)  Wt 160 lb 3.2 oz (72.666 kg)  BMI 30.29 kg/m2  SpO2 100% Weight down 4 lbs from 07-07-15 Alert, oriented and appropriate. Uses WC for office, for chronic orthopedic problems. Respirations not labored RA. Does not appear in any significant discomfort.   HEENT:PERRL, sclerae not icteric. Oral mucosa moist without lesions, posterior pharynx clear.   No JVD. Mucous membranes somewhat pale Lymphatics: fat pads in bilateral supraclavicular areas, not clearly adenopathy there Resp: clear to auscultation bilaterally and normal percussion bilaterally Cardio: regular rate and rhythm. No gallop. GI: soft, nontender, not distended, no mass or organomegaly. Normally active  bowel sounds.  Musculoskeletal/ Extremities: without pitting  edema, cords, tenderness Neuro: no peripheral neuropathy. Otherwise nonfocal. PSYCH mood and affect seem appropriate Skin without rash, ecchymosis, petechiae. Nailbeds pale   Lab Results:  Results for orders placed or performed in visit on 07/31/15  CBC with Differential  Result Value Ref Range   WBC 4.1 3.9 - 10.3 10e3/uL   NEUT# 2.8 1.5 - 6.5 10e3/uL   HGB 9.8 (L) 11.6 - 15.9 g/dL   HCT 30.3 (L) 34.8 - 46.6 %   Platelets 282 145 - 400 10e3/uL   MCV 74.8 (L) 79.5 - 101.0 fL   MCH 24.3 (L) 25.1 - 34.0 pg   MCHC 32.4 31.5 - 36.0 g/dL   RBC 4.05 3.70 - 5.45 10e6/uL   RDW 17.2 (H) 11.2 - 14.5 %   lymph# 0.7 (L) 0.9 - 3.3 10e3/uL   MONO# 0.4 0.1 - 0.9 10e3/uL   Eosinophils Absolute 0.1 0.0 - 0.5 10e3/uL   Basophils Absolute 0.0 0.0 - 0.1 10e3/uL   NEUT% 70.1 38.4 - 76.8 %   LYMPH% 16.5 14.0 - 49.7 %   MONO% 9.4 0.0 - 14.0 %   EOS% 3.3 0.0 - 7.0 %   BASO% 0.7 0.0 - 2.0 %   CMET pending  Studies/Results:  No results found.  Medications: I have reviewed the patient's current medications. Instructed to take oral iron on empty stomach with OJ or Vit C  Has decadron and antiemetics available  Carbo taxol and granix approved.  DISCUSSION  Offered second opinion elsewhere, which she declines Discussed increased chance of further problems the longer she goes without any treatment for this . I have explained that heavy vaginal bleeding may require urgent radiation to control, tho chemo may be able to improve bleeding if not an urgent situation.  Not certain if plans for great grandson cleared by school district as she had hoped, which would be for change of schools after spring break (break week of 4-10).  She agrees to scheduling first carbo taxol ~ 08-11-15. She is instructed to call if soaking > 4 pads in 24 hrs.   Medications discussed as above.   Letter written for medical excuse from jury duty 08-12-15.   Assessment/Plan:   1.high grade serous endometrial carcinoma with  focal endometrioid adenocarcinoma, extensively involving cervix and upper left vaginal fornix, with adenopathy on CT (retroperitoneal, bilateral iliac, right inguinal) and involvement posterior to uterus possibly direct extension vs adnexal: Per gyn onc [place] 06-23-15 based on extent of pelvic disease, and with compliance concerns, recommendation now is for [place] taxol as initial intervention, likely for 4 cycles then reimage, consider additional chemo vs surgery, possible radiation after completion depending on response (or RT sooner if heavy bleeding). Despite all efforts, she has delayed start of treatment to this point. Hopefully will begin first carbo taxol on 08-11-15 (initially planned 06-30-15).  2.iron deficiency anemia: serum iron 18 and %sat 8 on 06-06-15. Now on oral iron, continues to bleed. No problems with first IV feraheme on 06-26-15, but reaction 5 min into second dose on 07-07-15 such that we will not try further feraheme. Optimize oral iron administration as noted. Follow.  3.sickle cell trait 4.flu vaccine refused 5.up to date mammograms and colonoscopy 6.chronic orthopedic problems legs 7.past tobacco 8.BTL, tonsillectomy 9.Advance Directives in place 10.social concerns: financial and other support. As above   Chemo orders confirmed, will need to check doses by labs day of treatment. RN to follow up with patient late next  week to review decadron times. Verbal consent obtained. Time spent 25 min including >50% counseling and coordination of care. Cc Drs Denman George and Sondra Come as update.   Adrien Dietzman P, MD   07/31/2015, 9:05 AM

## 2015-07-31 NOTE — Telephone Encounter (Signed)
appt made and avs printed °

## 2015-08-08 ENCOUNTER — Telehealth: Payer: Self-pay

## 2015-08-08 NOTE — Telephone Encounter (Signed)
Spoke with Kristen Greene  to review information noted below below by Dr. Marko Plume. Kristen Greene said that she has a paper and she has go it. Attempted to continue and Kristen Greene stated ths she has got. Said have a good weekend and conversation ended.

## 2015-08-08 NOTE — Telephone Encounter (Signed)
-----   Message from Gordy Levan, MD sent at 07/31/2015  9:38 AM EDT ----- Please speak with patient ~ 4-13 or 4-14. Review times for decadron prior to first taxol hopefully ~ 4-17,  antiemetics. Remind her that she might have taxol aches.  thanks

## 2015-08-11 ENCOUNTER — Ambulatory Visit: Payer: PPO

## 2015-08-11 ENCOUNTER — Other Ambulatory Visit: Payer: PPO

## 2015-08-11 ENCOUNTER — Telehealth: Payer: Self-pay

## 2015-08-11 NOTE — Telephone Encounter (Signed)
Pt no show chemo today, D1C1 taxol, carbo. Called pt. She did call on call RN last night to cancel today. She is not feeling well. She has a cough and "not feeling well". She has not taken her temp. Will let Dr Marko Plume know.

## 2015-08-15 NOTE — Telephone Encounter (Signed)
Re did not come for chemo today. Multiple delays and cancellations. Has not begun treatment, so is not neutropenic. Fine to see PCP etc if needed for respiratory symptoms now. Has MD apt set up, will discuss chemo again then.  Kristen Greene

## 2015-08-15 NOTE — Telephone Encounter (Signed)
Spoke with ms. Klempner and she is planning to be at the [facility] 08-18-15 at  1100 for lab and see Dr. Marko Plume.

## 2015-08-17 ENCOUNTER — Other Ambulatory Visit: Payer: Self-pay | Admitting: Oncology

## 2015-08-17 DIAGNOSIS — C541 Malignant neoplasm of endometrium: Secondary | ICD-10-CM

## 2015-08-17 DIAGNOSIS — D500 Iron deficiency anemia secondary to blood loss (chronic): Secondary | ICD-10-CM

## 2015-08-17 DIAGNOSIS — N950 Postmenopausal bleeding: Secondary | ICD-10-CM

## 2015-08-18 ENCOUNTER — Other Ambulatory Visit: Payer: PPO

## 2015-08-18 ENCOUNTER — Ambulatory Visit: Payer: PPO | Admitting: Oncology

## 2015-08-24 ENCOUNTER — Other Ambulatory Visit: Payer: Self-pay | Admitting: Oncology

## 2015-08-25 ENCOUNTER — Encounter: Payer: Self-pay | Admitting: Oncology

## 2015-08-25 ENCOUNTER — Telehealth: Payer: Self-pay | Admitting: Oncology

## 2015-08-25 ENCOUNTER — Ambulatory Visit (HOSPITAL_BASED_OUTPATIENT_CLINIC_OR_DEPARTMENT_OTHER): Payer: PPO | Admitting: Oncology

## 2015-08-25 ENCOUNTER — Other Ambulatory Visit (HOSPITAL_BASED_OUTPATIENT_CLINIC_OR_DEPARTMENT_OTHER): Payer: PPO

## 2015-08-25 ENCOUNTER — Telehealth: Payer: Self-pay

## 2015-08-25 VITALS — BP 138/87 | HR 83 | Temp 98.6°F | Resp 17 | Ht 61.0 in | Wt 161.8 lb

## 2015-08-25 DIAGNOSIS — Z91199 Patient's noncompliance with other medical treatment and regimen due to unspecified reason: Secondary | ICD-10-CM

## 2015-08-25 DIAGNOSIS — D573 Sickle-cell trait: Secondary | ICD-10-CM

## 2015-08-25 DIAGNOSIS — N939 Abnormal uterine and vaginal bleeding, unspecified: Secondary | ICD-10-CM | POA: Diagnosis not present

## 2015-08-25 DIAGNOSIS — Z9119 Patient's noncompliance with other medical treatment and regimen: Secondary | ICD-10-CM

## 2015-08-25 DIAGNOSIS — D500 Iron deficiency anemia secondary to blood loss (chronic): Secondary | ICD-10-CM | POA: Diagnosis not present

## 2015-08-25 DIAGNOSIS — C541 Malignant neoplasm of endometrium: Secondary | ICD-10-CM | POA: Diagnosis not present

## 2015-08-25 DIAGNOSIS — Z9111 Patient's noncompliance with dietary regimen: Secondary | ICD-10-CM

## 2015-08-25 DIAGNOSIS — N950 Postmenopausal bleeding: Secondary | ICD-10-CM

## 2015-08-25 LAB — CBC WITH DIFFERENTIAL/PLATELET
BASO%: 0.2 % (ref 0.0–2.0)
BASOS ABS: 0 10*3/uL (ref 0.0–0.1)
EOS ABS: 0.1 10*3/uL (ref 0.0–0.5)
EOS%: 2.8 % (ref 0.0–7.0)
HEMATOCRIT: 25.8 % — AB (ref 34.8–46.6)
HGB: 8.1 g/dL — ABNORMAL LOW (ref 11.6–15.9)
LYMPH%: 17.8 % (ref 14.0–49.7)
MCH: 24.1 pg — AB (ref 25.1–34.0)
MCHC: 31.4 g/dL — ABNORMAL LOW (ref 31.5–36.0)
MCV: 76.8 fL — AB (ref 79.5–101.0)
MONO#: 0.4 10*3/uL (ref 0.1–0.9)
MONO%: 8.6 % (ref 0.0–14.0)
NEUT#: 3 10*3/uL (ref 1.5–6.5)
NEUT%: 70.6 % (ref 38.4–76.8)
Platelets: 233 10*3/uL (ref 145–400)
RBC: 3.36 10*6/uL — ABNORMAL LOW (ref 3.70–5.45)
RDW: 16.5 % — AB (ref 11.2–14.5)
WBC: 4.3 10*3/uL (ref 3.9–10.3)
lymph#: 0.8 10*3/uL — ABNORMAL LOW (ref 0.9–3.3)

## 2015-08-25 LAB — COMPREHENSIVE METABOLIC PANEL
ALT: <9 U/L (ref 0–55)
AST: 11 U/L (ref 5–34)
Albumin: 3.1 g/dL — ABNORMAL LOW (ref 3.5–5.0)
Alkaline Phosphatase: 74 U/L (ref 40–150)
Anion Gap: 9 mEq/L (ref 3–11)
BUN: 8.3 mg/dL (ref 7.0–26.0)
CALCIUM: 8.9 mg/dL (ref 8.4–10.4)
CO2: 24 mEq/L (ref 22–29)
Chloride: 109 mEq/L (ref 98–109)
Creatinine: 0.6 mg/dL (ref 0.6–1.1)
Glucose: 95 mg/dl (ref 70–140)
POTASSIUM: 3.8 meq/L (ref 3.5–5.1)
Sodium: 142 mEq/L (ref 136–145)
TOTAL PROTEIN: 7.3 g/dL (ref 6.4–8.3)

## 2015-08-25 LAB — IRON AND TIBC
%SAT: 9 % — AB (ref 21–57)
IRON: 17 ug/dL — AB (ref 41–142)
TIBC: 189 ug/dL — ABNORMAL LOW (ref 236–444)
UIBC: 172 ug/dL (ref 120–384)

## 2015-08-25 NOTE — Telephone Encounter (Signed)
Kristen Greene called to let Dr Marko Plume know that the referral for Kristen Greene for palliative care is being processed today. [name] should receive a call in the next couple of days from Bud Face with palliative care. Dr. Marko Plume notified of referral being processed.

## 2015-08-25 NOTE — Telephone Encounter (Signed)
appt made and avs printed °

## 2015-08-25 NOTE — Progress Notes (Signed)
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 She missed follow up visit with me on 08-18-15 despite RN phone call prior to remind her.   Patient reports heavy vaginal bleeding "2 weeks ago" soaking 6 pads in 24 hours. She reports only minimal spotting now. She denies abdominal or pelvic discomfort. Bowels are moving regularly, denies any problems with evacuation. Is voiding without problems. No fever. No other bleeding. Denies increased SOB with usual activities and denies chest pain. She did not take oral iron at all last week, has resumed this now, taking once daily on empty stomach with OJ. No change chronic orthopedic problems mostly left knee and hip.  Remainder of 10 point Review of Systems unchanged/ negative.   Patient reports young great grandson still living with her, which has previously been her reasoning for refusing treatment. His school will finish June then he will stay with another family member this summer. [facility] SW spoke again with patient after MD visit today.   No central catheter No genetics testing Refused flu vaccine Feraheme on 06-26-15, then reaction to second feraheme  ONCOLOGIC HISTORY Patient presented in early Dec 2016 with intermittent postmenopausal bleeding x 1-2 years; last PAP >10 years prior. Dr Mickle Mallory obtained PAP 03-31-15 with HGSIL and [place] with thickened endometrium and intrauterine mass. 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 She saw Dr Denman George on 05-30-15, with exam  noting encroachment of tumor onto the left anterior vaginal fornix, cervix 5 cm, friable, grossly replaced with tumor, uterus bulky with enlarged lower uterine segment and no apparent parametrial extension. Initial recommendation was for staging CT CAP and likely for initial chemoradiation + brachytherapy, then, if responding well to treatment, to have extrafascial hysterectomy 6 weeks out from completion of radiation, followed by additional 4 cycles of adjuvant carboplatin taxol. CT CAP 06-03-2015 showed no metastatic disease in chest or other organs, a 7.4 x 4.0 x 6.3 cm mass involving uterine corpus and cervix, and abutting anterior wall of rectum, some adenopathy retroperitoneal/ bilateral iliac, right inguinal regions. She saw Dr Sondra Come in consultation on 06-04-15, however subsequently cancelled simulation at least x2. Case was presented at gyn oncology multidisciplinary conference with recommendation to proceed with initial carboplatin taxol chemotherapy x 3-4 cycles, then consider additional chemotherapy vs surgery, possibly with radiation at completion if good response.  As of 07-07-15, patient has not agreed to start treatment.  She had first feraheme on 06-26-15 without difficulty, then reaction shortly after start of second dose on 07-07-15.  Objective:  Vital signs in last 24 hours:  BP 138/87 mmHg  Pulse 83  Temp(Src) 98.6 F (37 C) (Oral)  Resp 17  Ht '5\' 1"'$  (1.549 m)  Wt 161 lb 12.8 oz (73.392 kg)  BMI 30.59 kg/m2  SpO2 100% In WC for office. Appears comfortable, respirations not labored RA. Cooperative, neatly groomed, pleasant.  Alert, oriented and appropriate.   HEENT:PERRL, sclerae not icteric. Oral mucosa moist without lesions, posterior pharynx clear.  No JVD. Tongue not markedly smooth. Lymphatics:no supraclavicular or inguinal adenopathy Resp: clear to auscultation bilaterally Cardio: regular rate and  rhythm. No gallop. GI: soft, nontender, not distended, no mass or  organomegaly. Normally active bowel sounds.  Musculoskeletal/ Extremities: without pitting edema, cords, tenderness Neuro: speech fluent, moves all extremities, no focal deficits on exam in WC. Affect not clearly depressed.  Skin without rash, ecchymosis, petechiae. Mucous membranes somewhat pale.   Lab Results:  Results for orders placed or performed in visit on 08/25/15  CBC with Differential  Result Value Ref Range   WBC 4.3 3.9 - 10.3 10e3/uL   NEUT# 3.0 1.5 - 6.5 10e3/uL   HGB 8.1 (L) 11.6 - 15.9 g/dL   HCT 42.4 (L) 58.2 - 00.3 %   Platelets 233 145 - 400 10e3/uL   MCV 76.8 (L) 79.5 - 101.0 fL   MCH 24.1 (L) 25.1 - 34.0 pg   MCHC 31.4 (L) 31.5 - 36.0 g/dL   RBC 4.34 (L) 9.41 - 4.44 10e6/uL   RDW 16.5 (H) 11.2 - 14.5 %   lymph# 0.8 (L) 0.9 - 3.3 10e3/uL   MONO# 0.4 0.1 - 0.9 10e3/uL   Eosinophils Absolute 0.1 0.0 - 0.5 10e3/uL   Basophils Absolute 0.0 0.0 - 0.1 10e3/uL   NEUT% 70.6 38.4 - 76.8 %   LYMPH% 17.8 14.0 - 49.7 %   MONO% 8.6 0.0 - 14.0 %   EOS% 2.8 0.0 - 7.0 %   BASO% 0.2 0.0 - 2.0 %  Comprehensive metabolic panel  Result Value Ref Range   Sodium 142 136 - 145 mEq/L   Potassium 3.8 3.5 - 5.1 mEq/L   Chloride 109 98 - 109 mEq/L   CO2 24 22 - 29 mEq/L   Glucose 95 70 - 140 mg/dl   BUN 8.3 7.0 - 07.3 mg/dL   Creatinine 0.6 0.6 - 1.1 mg/dL   Total Bilirubin <5.99 0.20 - 1.20 mg/dL   Alkaline Phosphatase 74 40 - 150 U/L   AST 11 5 - 34 U/L   ALT <9 0 - 55 U/L   Total Protein 7.3 6.4 - 8.3 g/dL   Albumin 3.1 (L) 3.5 - 5.0 g/dL   Calcium 8.9 8.4 - 76.0 mg/dL   Anion Gap 9 3 - 11 mEq/L   EGFR >90 >90 ml/min/1.73 m2  Iron and TIBC  Result Value Ref Range   Iron 17 (L) 41 - 142 ug/dL   TIBC 805 (L) 108 - 681 ug/dL   UIBC 459 534 - 772 ug/dL   %SAT 9 (L) 21 - 57 %  CA 125  Result Value Ref Range   Cancer Antigen (CA) 125 651.1 (H) 0.0 - 38.1 U/mL     Iron studies available after visit  CA 125 available after visit  Studies/Results: I have shown patient  the PACs images from CT 06-03-15, particularly the sagittal view showing markedly enlarged uterus with posterior mass abutting rectal area.    Medications: I have reviewed the patient's current medications. She needs to take iron at least daily, preferably bid.  DISCUSSION Situation reviewed with patient as clearly as I am able, in addition to offering any support possible and trying to elicit her concerns. I have reviewed usefulness for radiation if heavy bleeding. I have again offered second opinion, follow up with Dr Roselind Messier due to recent increase in bleeding, further meeting with gyn oncology.  She declines all of these options.  Patient mentions her daughter, who died of sickle cell complications in her early 56s after extensive hospitalizations over years; there seem to be significant social concerns with the great grandson's  mother, which she does not specify.  Patient tells me that she prefers not to have any treatment, "when my time comes that is ok"; she again does not seem interested in trying any treatment in attempt to allow her to stay actively involved with the great grandson. I have been very clear that she will likely become more symptomatic from the extensive disease in pelvis and blood loss anemia. I have offered Palliative Care consultation, patient in agreement with referral. I have spoken directly with [facility] now, information given and Hospice MD to review - thank you!  We have discussed decrease in hemoglobin from ongoing bleeding. She wants to continue oral iron. She agrees to follow up at this office and knows that she can call prior to next scheduled visit if needed.   Did not try for additional counseling or psych evaluation now, as Palliative Care may have recommendations in this regard.  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 Despite all efforts, she has delayed start of treatment to this point, now refuses any treatment as above. Palliative Care referral made. Will continue some follow up at this office as she is certainly at high risk for significant complications and symptoms; note no PCP. 2.iron deficiency anemia: Iron studies still low, heavier bleeding intermittently. Optimize  oral iron. No problems with first IV feraheme on 06-26-15, but reaction 5 min into second dose on 07-07-15 such that we will not try further feraheme. RT would be most helpful if bleeding is heavy. 3.sickle cell trait 4.flu vaccine refused 5.up to date mammograms and colonoscopy 6.chronic orthopedic problems legs 7.past tobacco 8.BTL, tonsillectomy 9.Advance Directives in place 10.social concerns: significant family concerns. I do not think her sons or the niece know of the cancer diagnosis. Young great grandson lives with patient. Loss of daughter at age 79 after lifelong illness.    At completion of visit patient is in agreement with plans as above. Time spent 30 min including >50% counseling and coordination of care. Cancelled chemo which had been rescheduled for next week. Cc this note to Hospice MD (Dr Konrad Dolores) and update to Drs Denman George and Christain [place], MD   08/26/2015, 12:39 PM

## 2015-08-26 ENCOUNTER — Other Ambulatory Visit: Payer: Self-pay | Admitting: Oncology

## 2015-08-26 LAB — CA 125: CANCER ANTIGEN (CA) 125: 651.1 U/mL — AB (ref 0.0–38.1)

## 2015-09-01 ENCOUNTER — Other Ambulatory Visit: Payer: PPO

## 2015-09-01 ENCOUNTER — Ambulatory Visit: Payer: PPO

## 2015-09-14 ENCOUNTER — Other Ambulatory Visit: Payer: Self-pay | Admitting: Oncology

## 2015-09-14 DIAGNOSIS — C541 Malignant neoplasm of endometrium: Secondary | ICD-10-CM

## 2015-09-14 DIAGNOSIS — D500 Iron deficiency anemia secondary to blood loss (chronic): Secondary | ICD-10-CM

## 2015-09-15 ENCOUNTER — Other Ambulatory Visit (HOSPITAL_BASED_OUTPATIENT_CLINIC_OR_DEPARTMENT_OTHER): Payer: PPO

## 2015-09-15 ENCOUNTER — Telehealth: Payer: Self-pay

## 2015-09-15 ENCOUNTER — Encounter: Payer: Self-pay | Admitting: Oncology

## 2015-09-15 ENCOUNTER — Telehealth: Payer: Self-pay | Admitting: Oncology

## 2015-09-15 ENCOUNTER — Ambulatory Visit (HOSPITAL_BASED_OUTPATIENT_CLINIC_OR_DEPARTMENT_OTHER): Payer: PPO | Admitting: Oncology

## 2015-09-15 VITALS — BP 142/82 | HR 75 | Temp 98.8°F | Resp 18 | Ht 61.0 in | Wt 164.4 lb

## 2015-09-15 DIAGNOSIS — C541 Malignant neoplasm of endometrium: Secondary | ICD-10-CM | POA: Diagnosis not present

## 2015-09-15 DIAGNOSIS — N939 Abnormal uterine and vaginal bleeding, unspecified: Secondary | ICD-10-CM

## 2015-09-15 DIAGNOSIS — D500 Iron deficiency anemia secondary to blood loss (chronic): Secondary | ICD-10-CM

## 2015-09-15 DIAGNOSIS — Z9111 Patient's noncompliance with dietary regimen: Secondary | ICD-10-CM

## 2015-09-15 DIAGNOSIS — Z91199 Patient's noncompliance with other medical treatment and regimen due to unspecified reason: Secondary | ICD-10-CM

## 2015-09-15 LAB — CBC WITH DIFFERENTIAL/PLATELET
BASO%: 0.6 % (ref 0.0–2.0)
BASOS ABS: 0 10*3/uL (ref 0.0–0.1)
EOS ABS: 0.1 10*3/uL (ref 0.0–0.5)
EOS%: 2.8 % (ref 0.0–7.0)
HCT: 25.1 % — ABNORMAL LOW (ref 34.8–46.6)
HGB: 8 g/dL — ABNORMAL LOW (ref 11.6–15.9)
LYMPH%: 14 % (ref 14.0–49.7)
MCH: 23.7 pg — AB (ref 25.1–34.0)
MCHC: 31.7 g/dL (ref 31.5–36.0)
MCV: 74.8 fL — AB (ref 79.5–101.0)
MONO#: 0.4 10*3/uL (ref 0.1–0.9)
MONO%: 9.4 % (ref 0.0–14.0)
NEUT%: 73.2 % (ref 38.4–76.8)
NEUTROS ABS: 3.2 10*3/uL (ref 1.5–6.5)
PLATELETS: 341 10*3/uL (ref 145–400)
RBC: 3.36 10*6/uL — AB (ref 3.70–5.45)
RDW: 16.4 % — ABNORMAL HIGH (ref 11.2–14.5)
WBC: 4.4 10*3/uL (ref 3.9–10.3)
lymph#: 0.6 10*3/uL — ABNORMAL LOW (ref 0.9–3.3)

## 2015-09-15 LAB — COMPREHENSIVE METABOLIC PANEL
ALBUMIN: 2.9 g/dL — AB (ref 3.5–5.0)
ALK PHOS: 74 U/L (ref 40–150)
ALT: <9 U/L (ref 0–55)
ANION GAP: 10 meq/L (ref 3–11)
AST: 10 U/L (ref 5–34)
BUN: 8.5 mg/dL (ref 7.0–26.0)
CO2: 24 mEq/L (ref 22–29)
Calcium: 8.8 mg/dL (ref 8.4–10.4)
Chloride: 109 mEq/L (ref 98–109)
Creatinine: 0.7 mg/dL (ref 0.6–1.1)
Glucose: 92 mg/dl (ref 70–140)
POTASSIUM: 3.8 meq/L (ref 3.5–5.1)
Sodium: 143 mEq/L (ref 136–145)
Total Protein: 7.4 g/dL (ref 6.4–8.3)

## 2015-09-15 NOTE — Telephone Encounter (Signed)
appt made and avs printed °

## 2015-09-15 NOTE — Telephone Encounter (Signed)
-----   Message from Gordy Levan, MD sent at 09/15/2015  8:57 AM EDT ----- In next couple of days, please call Charlotte, as I would like him to try again to meet with patient at least before she sees Dr Sondra Come on June 7 and me again on June12. We are trying to decide if she wants to try treatment beginning shortly after June 9 when the child will be away for summer, or if she wants strictly symptom management.  thanks

## 2015-09-15 NOTE — Telephone Encounter (Signed)
Spoke with Mr. Kristen Greene and he will attempt to meet with Ms. Kristen Greene as noted below by Dr. Marko Plume.

## 2015-09-15 NOTE — Progress Notes (Signed)
OFFICE PROGRESS NOTE   Sep 21, 2015   Physicians: Everitt Amber, Carolyne Fiscal  INTERVAL HISTORY:   Patient is seen, alone for visit tho brought to office by niece, for scheduled follow up visit of advanced high grade serous endometrial carcinoma for which she has thus far declined any treatment. She is anemic due to continued bleeding from the endometrial cancer.   Palliative care visit was arranged when she was at this office last, however patient declined to talk with the RN when visit attempted; patient tells me that she "was feeling too bad to go downstairs that day".  Patient denies any pain other than chronic orthopedic problems. She has had intermittently heavy bleeding, including one day this week, and otherwise mostly spotting or light bleeding. She is tolerating oral iron without difficulty, will try increasing this to bid. Bowels are moving, some difficulty on one occasion. She has noticed some nonpainful swelling right ankle, better with elevation. She denies increased SOB, fever, cough, nausea. No other bleeding. Bladder ok. Appetite ok. Remainder of 10 point Review of Systems negative/ unchanged.    No central catheter No genetics testing Refused flu vaccine Feraheme given 06-26-15 without difficulty, then reaction to second feraheme No PRBCs given  Elementary school age great grandson who lives with patient will be staying elsewhere for the summer. At least as reported by patient, she has not wanted to do treatment while the child is with her.    ONCOLOGIC HISTORY Patient presented in early Dec 2016 with intermittent postmenopausal bleeding x 1-2 years; last PAP >10 years prior. Dr Mickle Mallory obtained PAP 03-31-15 with HGSIL and [place] with thickened endometrium and intrauterine mass. 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 She saw Dr Denman George on 05-30-15, with exam noting encroachment of tumor onto the left anterior vaginal fornix, cervix 5 cm, friable, grossly replaced with tumor, uterus bulky with enlarged lower uterine segment and no apparent parametrial extension. Initial recommendation was for staging CT CAP and likely for initial chemoradiation + brachytherapy, then, if responding well to treatment, to have extrafascial hysterectomy 6 weeks out from completion of radiation, followed by additional 4 cycles of adjuvant carboplatin taxol. CT CAP 06-03-2015 showed no metastatic disease in chest or other organs, a 7.4 x 4.0 x 6.3 cm mass involving uterine corpus and cervix, and abutting anterior wall of rectum, some adenopathy retroperitoneal/ bilateral iliac, right inguinal regions. She saw Dr Sondra Come in consultation on 06-04-15, however subsequently cancelled simulation at least x2. Case was presented at gyn oncology multidisciplinary conference with recommendation to proceed with initial carboplatin taxol chemotherapy x 3-4 cycles, then consider additional chemotherapy vs surgery, possibly with radiation at completion if good response.  As of 07-07-15, patient has not agreed to start treatment.  She had first feraheme on 06-26-15 without difficulty, then reaction shortly after start of second dose on 07-07-15.   Objective:  Vital signs in last 24 hours:  BP 142/82 mmHg  Pulse 75  Temp(Src) 98.8 F (37.1 C) (Oral)  Resp 18  Ht '5\' 1"'$  (1.549 m)  Wt 164 lb 6.4 oz (74.571 kg)  BMI 31.08 kg/m2  SpO2 100% Weight up 3 lbs.  Alert, oriented and appropriate, cooperative. Neatly groomed, respirations not labored, does not appear in any distress. Uses wheelchair for office, is ambulatory at home  No alopecia  HEENT:PERRL, sclerae  not icteric. Oral mucosa moist without lesions, posterior pharynx clear.  Neck supple. No JVD. Mucous membranes and conjunctivae  pale. Lymphatics:no cervical,supraclavicular adenopathy Resp: clear to auscultation bilaterally and normal percussion bilaterally Cardio: regular rate and rhythm. No gallop. GI: soft, nontender, not distended, no mass or organomegaly. Normally active bowel sounds.  Musculoskeletal/ Extremities: 1+ right pedal edema without cords or tenderness, otherwise extremities without pitting edema, cords, tenderness Neuro:  nonfocal on exam in WC. PSYCH  does not appear anxious Skin Nailbeds pale, otherwise without rash, ecchymosis, petechiae   Lab Results:  Results for orders placed or performed in visit on 09/15/15  CBC with Differential  Result Value Ref Range   WBC 4.4 3.9 - 10.3 10e3/uL   NEUT# 3.2 1.5 - 6.5 10e3/uL   HGB 8.0 (L) 11.6 - 15.9 g/dL   HCT 12.0 (L) 47.3 - 72.5 %   Platelets 341 145 - 400 10e3/uL   MCV 74.8 (L) 79.5 - 101.0 fL   MCH 23.7 (L) 25.1 - 34.0 pg   MCHC 31.7 31.5 - 36.0 g/dL   RBC 6.69 (L) 0.50 - 3.99 10e6/uL   RDW 16.4 (H) 11.2 - 14.5 %   lymph# 0.6 (L) 0.9 - 3.3 10e3/uL   MONO# 0.4 0.1 - 0.9 10e3/uL   Eosinophils Absolute 0.1 0.0 - 0.5 10e3/uL   Basophils Absolute 0.0 0.0 - 0.1 10e3/uL   NEUT% 73.2 38.4 - 76.8 %   LYMPH% 14.0 14.0 - 49.7 %   MONO% 9.4 0.0 - 14.0 %   EOS% 2.8 0.0 - 7.0 %   BASO% 0.6 0.0 - 2.0 %  Comprehensive metabolic panel  Result Value Ref Range   Sodium 143 136 - 145 mEq/L   Potassium 3.8 3.5 - 5.1 mEq/L   Chloride 109 98 - 109 mEq/L   CO2 24 22 - 29 mEq/L   Glucose 92 70 - 140 mg/dl   BUN 8.5 7.0 - 71.3 mg/dL   Creatinine 0.7 0.6 - 1.1 mg/dL   Total Bilirubin <7.34 0.20 - 1.20 mg/dL   Alkaline Phosphatase 74 40 - 150 U/L   AST 10 5 - 34 U/L   ALT <9 0 - 55 U/L   Total Protein 7.4 6.4 - 8.3 g/dL   Albumin 2.9 (L) 3.5 - 5.0 g/dL   Calcium 8.8 8.4 - 56.3 mg/dL   Anion Gap 10 3 - 11 mEq/L   EGFR >90 >90 ml/min/1.73 m2     Studies/Results:  I have shown patient PACs images for CT AP 05-2015, with large uterine/ pelvic mass in  close proximity to rectum  Medications: I have reviewed the patient's current medications. Try increasing oral iron to bid.  DISCUSSION  Encouraged her to reschedule with palliative care, as discussion with that team may be very useful.  Again offered chemotherapy or radiation therapy. Reminded patient that radiation can often be very useful in controlling heavy bleeding. I have asked patient if she might consider starting treatment with either chemo or possibly radiation while the child is away for summer vacation (beginning mid June). She does not want to commit to this, but did agree to scheduling back to Dr Roselind Messier for discussion in early June.  If she still prefers no treatment, she would be appropriate for Hospice when they can be of benefit (and hopefully Palliative Care prior to that).   Patient would like to establish with PCP "across the street from home", does not know name of that facility. We are glad to assist  with referral if needed.    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 Despite all efforts, she has declined start of treatment.   Palliative Care referral made, visit attempted however patient declined to meet with RN then.  I do not know that she will agree to treatment even during summer, but will at least try to get her back to rad onc due to bleeding.  Will continue some follow up at this office for now, as she is certainly at high risk for significant complications and symptoms; note  no PCP. 2.iron deficiency anemia: Iron studies still low, heavier bleeding intermittently. Optimize oral iron. No problems with first IV feraheme on 06-26-15, but reaction 5 min into second dose on 07-07-15 such that we will not try further feraheme. RT would be most helpful if bleeding is heavy. May need transfusion if drops below 8.0  3.sickle cell trait 4.flu vaccine refused 5.up to date mammograms and colonoscopy 6.chronic orthopedic problems legs 7.past tobacco 8.BTL, tonsillectomy 9.Advance Directives in place 10.social concerns: significant family concerns. I do not think her sons or the niece know of the cancer diagnosis. Young great grandson lives with patient. Loss of daughter at age 26 after lifelong illness. Seem to be more family concerns than she has shared.   I have given patient opportunity to ask all of her questions. I do not believe niece is aware of diagnosis. Time spent 25 min including >50% counseling and coordination of care. Cc Drs Sondra Come and Bartholomew Boards, MD   09/21/2015, 7:29 PM

## 2015-10-01 ENCOUNTER — Ambulatory Visit
Admission: RE | Admit: 2015-10-01 | Discharge: 2015-10-01 | Disposition: A | Discharge status: Home / Self Care | Payer: PPO | Source: Ambulatory Visit | Attending: Radiation Oncology | Admitting: Radiation Oncology

## 2015-10-01 ENCOUNTER — Ambulatory Visit: Payer: PPO

## 2015-10-01 ENCOUNTER — Telehealth: Payer: Self-pay | Admitting: Radiation Oncology

## 2015-10-01 NOTE — Telephone Encounter (Signed)
Patient called to cancel appointment, she wants to meet with Dr Marko Plume first on the 12th of June then reschedule appointment with Dr Sondra Come after that appointment

## 2015-10-03 ENCOUNTER — Other Ambulatory Visit: Payer: Self-pay

## 2015-10-03 DIAGNOSIS — C541 Malignant neoplasm of endometrium: Secondary | ICD-10-CM

## 2015-10-05 ENCOUNTER — Other Ambulatory Visit: Payer: Self-pay | Admitting: Oncology

## 2015-10-06 ENCOUNTER — Ambulatory Visit: Payer: PPO | Admitting: Oncology

## 2015-10-06 ENCOUNTER — Other Ambulatory Visit: Payer: PPO

## 2015-11-25 DIAGNOSIS — D649 Anemia, unspecified: Secondary | ICD-10-CM

## 2015-11-25 DIAGNOSIS — N390 Urinary tract infection, site not specified: Secondary | ICD-10-CM

## 2015-11-25 HISTORY — DX: Urinary tract infection, site not specified: N39.0

## 2015-11-25 HISTORY — DX: Anemia, unspecified: D64.9

## 2015-12-14 ENCOUNTER — Encounter (HOSPITAL_BASED_OUTPATIENT_CLINIC_OR_DEPARTMENT_OTHER): Payer: Self-pay | Admitting: *Deleted

## 2015-12-14 ENCOUNTER — Inpatient Hospital Stay (HOSPITAL_BASED_OUTPATIENT_CLINIC_OR_DEPARTMENT_OTHER)
Admission: EM | Admit: 2015-12-14 | Discharge: 2015-12-15 | DRG: 812 | Disposition: A | Discharge status: Home / Self Care | Payer: PPO | Attending: Internal Medicine | Admitting: Internal Medicine

## 2015-12-14 DIAGNOSIS — D500 Iron deficiency anemia secondary to blood loss (chronic): Secondary | ICD-10-CM | POA: Diagnosis not present

## 2015-12-14 DIAGNOSIS — D649 Anemia, unspecified: Secondary | ICD-10-CM

## 2015-12-14 DIAGNOSIS — Z9119 Patient's noncompliance with other medical treatment and regimen: Secondary | ICD-10-CM

## 2015-12-14 DIAGNOSIS — N939 Abnormal uterine and vaginal bleeding, unspecified: Secondary | ICD-10-CM

## 2015-12-14 DIAGNOSIS — R7303 Prediabetes: Secondary | ICD-10-CM | POA: Diagnosis present

## 2015-12-14 DIAGNOSIS — Z66 Do not resuscitate: Secondary | ICD-10-CM | POA: Diagnosis present

## 2015-12-14 DIAGNOSIS — E876 Hypokalemia: Secondary | ICD-10-CM | POA: Diagnosis present

## 2015-12-14 DIAGNOSIS — E440 Moderate protein-calorie malnutrition: Secondary | ICD-10-CM | POA: Diagnosis present

## 2015-12-14 DIAGNOSIS — Z6827 Body mass index (BMI) 27.0-27.9, adult: Secondary | ICD-10-CM

## 2015-12-14 DIAGNOSIS — Z79899 Other long term (current) drug therapy: Secondary | ICD-10-CM

## 2015-12-14 DIAGNOSIS — Z87891 Personal history of nicotine dependence: Secondary | ICD-10-CM

## 2015-12-14 DIAGNOSIS — Z91199 Patient's noncompliance with other medical treatment and regimen due to unspecified reason: Secondary | ICD-10-CM

## 2015-12-14 DIAGNOSIS — D573 Sickle-cell trait: Secondary | ICD-10-CM | POA: Diagnosis present

## 2015-12-14 DIAGNOSIS — Z833 Family history of diabetes mellitus: Secondary | ICD-10-CM

## 2015-12-14 DIAGNOSIS — Z888 Allergy status to other drugs, medicaments and biological substances status: Secondary | ICD-10-CM

## 2015-12-14 DIAGNOSIS — C541 Malignant neoplasm of endometrium: Secondary | ICD-10-CM | POA: Diagnosis present

## 2015-12-14 DIAGNOSIS — N390 Urinary tract infection, site not specified: Secondary | ICD-10-CM | POA: Diagnosis present

## 2015-12-14 HISTORY — DX: Reserved for inherently not codable concepts without codable children: IMO0001

## 2015-12-14 HISTORY — DX: Gastro-esophageal reflux disease without esophagitis: K21.9

## 2015-12-14 HISTORY — DX: Anemia, unspecified: D64.9

## 2015-12-14 HISTORY — DX: Urinary tract infection, site not specified: N39.0

## 2015-12-14 LAB — CBC WITH DIFFERENTIAL/PLATELET
Basophils Absolute: 0 10*3/uL (ref 0.0–0.1)
Basophils Relative: 0 %
Eosinophils Absolute: 0.1 10*3/uL (ref 0.0–0.7)
Eosinophils Relative: 1 %
HCT: 20.4 % — ABNORMAL LOW (ref 36.0–46.0)
Hemoglobin: 5.8 g/dL — CL (ref 12.0–15.0)
Lymphocytes Relative: 14 %
Lymphs Abs: 0.9 10*3/uL (ref 0.7–4.0)
MCH: 20.1 pg — ABNORMAL LOW (ref 26.0–34.0)
MCHC: 28.4 g/dL — ABNORMAL LOW (ref 30.0–36.0)
MCV: 70.8 fL — ABNORMAL LOW (ref 78.0–100.0)
Monocytes Absolute: 0.7 10*3/uL (ref 0.1–1.0)
Monocytes Relative: 12 %
Neutro Abs: 4.4 10*3/uL (ref 1.7–7.7)
Neutrophils Relative %: 73 %
Platelets: 399 10*3/uL (ref 150–400)
RBC: 2.88 MIL/uL — ABNORMAL LOW (ref 3.87–5.11)
RDW: 19.2 % — ABNORMAL HIGH (ref 11.5–15.5)
WBC: 6.1 10*3/uL (ref 4.0–10.5)

## 2015-12-14 LAB — URINALYSIS, ROUTINE W REFLEX MICROSCOPIC
GLUCOSE, UA: NEGATIVE mg/dL
KETONES UR: 15 mg/dL — AB
NITRITE: NEGATIVE
PH: 5.5 (ref 5.0–8.0)
PROTEIN: 100 mg/dL — AB
Specific Gravity, Urine: 1.024 (ref 1.005–1.030)

## 2015-12-14 LAB — BASIC METABOLIC PANEL
Anion gap: 9 (ref 5–15)
BUN: 15 mg/dL (ref 6–20)
CO2: 24 mmol/L (ref 22–32)
Calcium: 8.2 mg/dL — ABNORMAL LOW (ref 8.9–10.3)
Chloride: 105 mmol/L (ref 101–111)
Creatinine, Ser: 0.89 mg/dL (ref 0.44–1.00)
GFR calc Af Amer: >60 mL/min (ref >60)
GFR calc non Af Amer: >60 mL/min (ref >60)
Glucose, Bld: 123 mg/dL — ABNORMAL HIGH (ref 65–99)
Potassium: 3.5 mmol/L (ref 3.5–5.1)
Sodium: 138 mmol/L (ref 135–145)

## 2015-12-14 LAB — URINE MICROSCOPIC-ADD ON

## 2015-12-14 LAB — I-STAT CG4 LACTIC ACID, ED: Lactic Acid, Venous: 1.85 mmol/L (ref 0.5–1.9)

## 2015-12-14 MED ORDER — SODIUM CHLORIDE 0.9 % IV BOLUS (SEPSIS)
1000.0000 mL | Freq: Once | INTRAVENOUS | Status: AC
  Administered 2015-12-14: 1000 mL via INTRAVENOUS

## 2015-12-14 MED ORDER — DEXTROSE 5 % IV SOLN
1.0000 g | Freq: Once | INTRAVENOUS | Status: AC
  Administered 2015-12-14: 1 g via INTRAVENOUS
  Filled 2015-12-14: qty 10

## 2015-12-14 MED ORDER — CEPHALEXIN 250 MG PO CAPS: 500.0000 mg | ORAL_CAPSULE | Freq: Once | ORAL | Status: DC

## 2015-12-14 MED ORDER — SODIUM CHLORIDE 0.9 % IV BOLUS (SEPSIS)
500.0000 mL | Freq: Once | INTRAVENOUS | Status: AC
  Administered 2015-12-14: 500 mL via INTRAVENOUS

## 2015-12-14 NOTE — ED Notes (Signed)
RN went to room to assess patient. Patient states she does not want to go by ambulance to another hospital. She states she understands the risk but still wants to go home before going to another hospital for treatment. EDP aware.

## 2015-12-14 NOTE — ED Notes (Signed)
PA at bedside.

## 2015-12-14 NOTE — ED Triage Notes (Signed)
Patient states she has a one week history of burning sensation with urination, urgency and fatigue.

## 2015-12-14 NOTE — Progress Notes (Addendum)
Called for transfer from [facility], PA-C for anemia  Came in today for UTI, dysuria, foul-smelling urine, abdominal pain, frequent urination.  Has UTI.  Pulse up and low grade temp, but lactate and WBC normal.    BP 96/57 (BP Location: Left Arm)   Pulse 100   Temp 99.4 F (37.4 C)   Resp 20   Ht 5\' 1"  (1.549 m)   Wt 66 kg (145 lb 8 oz)   SpO2 100%   BMI 27.49 kg/m    Incidentally, she is also very weak, tired.  Hgb 5.8.  Breathless with exertion, palpitations.  Turns out, she has endometrial cancer, not being treated.  Has had vaginal bleeding, been using 20 pads per day vaginally, for a while.  Waxing and waning over months maybe.  No melena or hematochezia.    Will give cephalexin for UTI.    FOr anemia and hypotension, will give 1L NS and repeat SBP (BP normally [id number] systolics, so 0000000 is low).   If repeat BP is >110, will accept to med surg bed, otherwise will repeat bolus and transfer to Stepdown.    Addendum 2115: Rectal temp 100.28F.  Will give ceftriaxone.  SBP 120s with standing, will accept to med surg bed.  Patient however threatening to leave.  If she accepts transfer, will go to Med surg bed.  Otherwise, will be triaged in the ER by EDP and we will discuss bed placement again.    Addendum [id number]: Patient agreed to transport.  BP has been stable 110s over 60s, will go to med surg bed. Type and screen ordered for [facility]

## 2015-12-14 NOTE — ED Provider Notes (Signed)
[facility] Provider Note   CSN: [id number] Arrival date & time: 12/14/15  1420  By signing my name below, I, Dolores Hoose, attest that this documentation has been prepared under the direction and in the presence of non-physician practitioner, Okey Regal, PA-C,. Electronically Signed: Dolores Hoose, Scribe. 12/14/2015. 6:48 PM.  History   Chief Complaint Chief Complaint  Patient presents with  . Urinary Tract Infection    The history is provided by the patient. No language interpreter was used.     HPI Comments:  Kristen Greene is a 65 y.o. female with PMHx of Endometrial Cancer who presents to the Emergency Department complaining of sudden-onset constant painful urination onset over the past few days. Patient has not had symptoms like this before, but suspects a possible urinary tract infection. Pt reports of associated fever, nausea, vomiting and abdominal tenderness worsened by palpation. No alleviating factors noted.   She also presents with waxing/waning vaginal bleeding onset several years. Pt indicates she has an endometrial tumor but has denied chemotherapy or radiation therapy. She reports abdominal pain, shortness of breath, and heart palpitations worsened by exertion. Pt describes episodes where she states the tumor "moves" to her left side, which worsens her shortness of breath and palpitations. No alleviating factors indicated. Dr. Marko Plume is her oncologist. Patient reports that she has gone through approximately 20 pads in the last day which is not abnormal for her.  Past Medical History:  Diagnosis Date  . Diabetes mellitus without complication (HCC)    borderline  . Endometrial cancer ([place]) dx'd 04/2015   FIGO [place] IIIB  . Fibroids   . Reflux   . Sickle-cell trait [facility])     Patient Active Problem List   Diagnosis Date Noted  . Symptomatic anemia 12/15/2015  . Vaginal bleeding, abnormal 07/31/2015  . H/O noncompliance with medical treatment,  presenting hazards to health 07/31/2015  . Postmenopausal vaginal bleeding 06/25/2015  . Noncompliance with therapeutic plan 06/25/2015  . Iron deficiency anemia due to chronic blood loss 06/06/2015  . Sickle cell trait ([place]) 06/06/2015  . International Federation of Gynecology and Obstetrics (FIGO) stage IIIB malignant neoplasm of endometrium ([place]) 06/06/2015  . Endometrial cancer, FIGO stage IIIB ([place]) 05/30/2015  . 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 Postmenopausal bleeding 04/24/2015  . 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 DILATATION & CURETTAGE/HYSTEROSCOPY WITH MYOSURE N/A 05/14/2015   Procedure: DILATATION & CURETTAGE/HYSTEROSCOPY ;  Surgeon: Sherlyn Hay, DO;  Location: [facility];  Service: Gynecology;  Laterality: N/A;  . LEEP N/A 05/14/2015   Procedure: LOOP ELECTROSURGICAL EXCISION PROCEDURE (LEEP);  Surgeon: Sherlyn Hay, DO;  Location: [facility];  Service: Gynecology;  Laterality: N/A;  . TONSILLECTOMY    . TUBAL LIGATION      OB History    No data available       Home Medications    Prior to Admission medications   Medication Sig Start Date End Date Taking? Authorizing Provider  ferrous fumarate (HEMOCYTE - 106 MG FE) 325 (106 Fe) MG TABS tablet Take 1 tablet daily on an Tempty stomach  With OJ or Vitamin C  tablet. Patient taking differently: Take 1 tablet daily on an empty stomach  With OJ or Vitamin C  tablet. 06/11/15  Yes Lennis Marion Downer, MD  Multiple Vitamin (THERA) TABS Take by mouth.   Yes Historical Provider, MD  dexamethasone (DECADRON) 4 MG tablet Take 5 tablets with food 12 hrs and 6 hrs prior to Taxol Chemotherapy. Patient not taking: Reported on 07/07/2015 06/23/15   Gordy Levan, MD  loratadine (EQ LORATADINE) 10 MG tablet Take 1 tablet (10 mg total) by mouth daily. 06/24/15   Lennis Marion Downer,  MD  LORazepam (ATIVAN) 0.5 MG tablet Take 1 tablet (0.5 mg total) by mouth every 8 (eight) hours. Sublingual or by mouth. Will make drowsy. Patient not taking: Reported on 06/23/2015 06/06/15   Gordy Levan, MD  naproxen sodium (ANAPROX) 220 MG tablet Take 220 mg by mouth 2 (two) times daily as needed. Reported on 09/15/2015    Historical Provider, MD  ondansetron (ZOFRAN) 8 MG tablet Take 1 tablet (8 mg total) by mouth every 8 (eight) hours as needed for nausea or vomiting. Will not make drowsy. Patient not taking: Reported on 06/23/2015 06/06/15   Gordy Levan, MD    Family History No family history on file.  Social History Social History  Substance Use Topics  . Smoking status: Former Research scientist (life sciences)  . Smokeless tobacco: Never Used  . Alcohol use No     Allergies   Feraheme [ferumoxytol]   Review of Systems Review of Systems  Constitutional: Positive for fever.  Gastrointestinal: Positive for abdominal pain, nausea and vomiting.  Genitourinary: Positive for dysuria.  All other systems reviewed and are negative.    Physical Exam Updated Vital Signs BP 112/65   Pulse 99   Temp 100.5 F (38.1 C) (Rectal)   Resp 18   Ht 5\' 1"  (1.549 m)   Wt 66 kg   SpO2 98%   BMI 27.49 kg/m   Physical Exam  Constitutional: She is oriented to person, place, and time. She appears well-developed and well-nourished. No distress.  HENT:  Head: Normocephalic and atraumatic.  Eyes: Conjunctivae are normal.  Cardiovascular: Normal rate.   Pulmonary/Chest: Effort normal.  Abdominal: Soft. She exhibits no distension. There is tenderness.  Abdomen soft, non-tender. No CVA tenderness. Minimal discomfort to palpation of LLQ, non-reproducible.   Neurological: She is alert and oriented to person, place, and time.  Skin: Skin is warm and dry.  Psychiatric: She has a normal mood and affect.  Nursing note and vitals reviewed.    ED Treatments / Results  DIAGNOSTIC STUDIES:  Oxygen Saturation  is 100% on RA, normal by my interpretation.    COORDINATION OF CARE:  6:49 PM Discussed treatment plan with pt at bedside which included antibiotics and blood work and pt agreed to plan.  7:50PM Discussed further treatment plan with pt at bedside which included blood order and consult with Dr. Marko Plume and pt agreed to plan.   Labs (all labs ordered are listed, but only abnormal results are displayed) Labs Reviewed  URINALYSIS, ROUTINE W REFLEX MICROSCOPIC (NOT AT [facility]) - Abnormal; Notable for the following:       Result Value   Color, Urine AMBER (*)    APPearance CLOUDY (*)    Hgb urine dipstick LARGE (*)    Bilirubin Urine MODERATE (*)    Ketones, ur 15 (*)    Protein, ur 100 (*)    Leukocytes, UA MODERATE (*)    All other components within normal limits  URINE MICROSCOPIC-ADD ON - Abnormal; Notable for the following:    Squamous Epithelial / LPF 0-5 (*)    Bacteria, UA MANY (*)    All other components within normal limits  CBC WITH DIFFERENTIAL/PLATELET - Abnormal;  Notable for the following:    RBC 2.88 (*)    Hemoglobin 5.8 (*)    HCT 20.4 (*)    MCV 70.8 (*)    MCH 20.1 (*)    MCHC 28.4 (*)    RDW 19.2 (*)    All other components within normal limits  BASIC METABOLIC PANEL - Abnormal; Notable for the following:    Glucose, Bld 123 (*)    Calcium 8.2 (*)    All other components within normal limits  RETICULOCYTES - Abnormal; Notable for the following:    RBC. 2.98 (*)    All other components within normal limits  URINE CULTURE  I-STAT CG4 LACTIC ACID, ED  I-STAT CG4 LACTIC ACID, ED  TYPE AND SCREEN    EKG  EKG Interpretation None       Radiology No results found.  Procedures Procedures (including critical care time)  Medications Ordered in ED Medications  sodium chloride 0.9 % bolus 1,000 mL (0 mLs Intravenous Stopped 12/14/15 2130)  cefTRIAXone (ROCEPHIN) 1 g in dextrose 5 % 50 mL IVPB (0 g Intravenous Stopped 12/14/15 2257)  sodium chloride 0.9 %  bolus 500 mL (0 mLs Intravenous Stopped 12/14/15 2257)     Initial Impression / Assessment and Plan / ED Course  I have reviewed the triage vital signs and the nursing notes.  Pertinent labs & imaging results that were available during my care of the patient were reviewed by me and considered in my medical decision making (see chart for details).  Clinical Course     Final Clinical Impressions(s) / ED Diagnoses   Final diagnoses:  Symptomatic anemia  Vaginal bleeding  UTI (lower urinary tract infection)   Labs: I-STAT lactic acid, CBC, BMP, reticulocytes, urinalysis  Imaging:   Consults:   Therapeutics:   Discharge Meds:  Assessment/Plan: 65 year old female presents today with complaints of urinary tract infection. Patient also reports complaints of fatigue. She is significantly anemic here 5.8, this is down from atrial fibrillation may and 9 in March. She has a history of endometrial cancer, refusing therapy. Patient blood loss secondary to vaginal bleeding. Patient reports that she would like to receive blood transfusion, but is adamant about invasive intervention including surgery.  Patient will need hospital admission for blood transfusion. Dr. Loleta Books consult and who agreed to accept patient, patient refused caroling transport and requested to go POV. She was given a dose of ceftriaxone and fluid here in the ED. After reassessment patient would like to go via [facility]. Patient's blood pressure has remained stable, reassuring vital signs. She is in no acute distress, will be transferred to [facility] for further evaluation and management.  New Prescriptions New Prescriptions   No medications on file  I personally performed the services described in this documentation, which was scribed in my presence. The recorded information has been reviewed and is accurate.    Okey Regal, PA-C 12/15/15 0126    Leonard Schwartz, MD 12/17/15 [id number]

## 2015-12-14 NOTE — ED Notes (Signed)
Pt states she thinks she may have a UTI. S/S x 1 week. Does have a hx of blood in urine. States she is supposed to have surgery to have ovaries removed.

## 2015-12-15 ENCOUNTER — Encounter (HOSPITAL_COMMUNITY): Payer: Self-pay | Admitting: Internal Medicine

## 2015-12-15 DIAGNOSIS — E440 Moderate protein-calorie malnutrition: Secondary | ICD-10-CM | POA: Diagnosis present

## 2015-12-15 DIAGNOSIS — N939 Abnormal uterine and vaginal bleeding, unspecified: Secondary | ICD-10-CM | POA: Diagnosis present

## 2015-12-15 DIAGNOSIS — Z9119 Patient's noncompliance with other medical treatment and regimen: Secondary | ICD-10-CM | POA: Diagnosis not present

## 2015-12-15 DIAGNOSIS — N3000 Acute cystitis without hematuria: Secondary | ICD-10-CM

## 2015-12-15 DIAGNOSIS — C541 Malignant neoplasm of endometrium: Secondary | ICD-10-CM

## 2015-12-15 DIAGNOSIS — Z6827 Body mass index (BMI) 27.0-27.9, adult: Secondary | ICD-10-CM | POA: Diagnosis not present

## 2015-12-15 DIAGNOSIS — D500 Iron deficiency anemia secondary to blood loss (chronic): Secondary | ICD-10-CM | POA: Diagnosis present

## 2015-12-15 DIAGNOSIS — N390 Urinary tract infection, site not specified: Secondary | ICD-10-CM | POA: Diagnosis present

## 2015-12-15 DIAGNOSIS — Z888 Allergy status to other drugs, medicaments and biological substances status: Secondary | ICD-10-CM | POA: Diagnosis not present

## 2015-12-15 DIAGNOSIS — E876 Hypokalemia: Secondary | ICD-10-CM | POA: Diagnosis present

## 2015-12-15 DIAGNOSIS — D573 Sickle-cell trait: Secondary | ICD-10-CM | POA: Diagnosis present

## 2015-12-15 DIAGNOSIS — Z833 Family history of diabetes mellitus: Secondary | ICD-10-CM | POA: Diagnosis not present

## 2015-12-15 DIAGNOSIS — Z79899 Other long term (current) drug therapy: Secondary | ICD-10-CM | POA: Diagnosis not present

## 2015-12-15 DIAGNOSIS — R7303 Prediabetes: Secondary | ICD-10-CM | POA: Diagnosis present

## 2015-12-15 DIAGNOSIS — D649 Anemia, unspecified: Secondary | ICD-10-CM | POA: Diagnosis not present

## 2015-12-15 DIAGNOSIS — Z66 Do not resuscitate: Secondary | ICD-10-CM | POA: Diagnosis present

## 2015-12-15 DIAGNOSIS — Z87891 Personal history of nicotine dependence: Secondary | ICD-10-CM | POA: Diagnosis not present

## 2015-12-15 LAB — CBC
HCT: 19.7 % — ABNORMAL LOW (ref 36.0–46.0)
HEMATOCRIT: 26.7 % — AB (ref 36.0–46.0)
HEMOGLOBIN: 5.5 g/dL — AB (ref 12.0–15.0)
Hemoglobin: 8 g/dL — ABNORMAL LOW (ref 12.0–15.0)
MCH: 19.9 pg — ABNORMAL LOW (ref 26.0–34.0)
MCH: 22.6 pg — AB (ref 26.0–34.0)
MCHC: 27.9 g/dL — ABNORMAL LOW (ref 30.0–36.0)
MCHC: 30 g/dL (ref 30.0–36.0)
MCV: 71.4 fL — ABNORMAL LOW (ref 78.0–100.0)
MCV: 75.4 fL — AB (ref 78.0–100.0)
PLATELETS: 307 10*3/uL (ref 150–400)
PLATELETS: 304 10*3/uL (ref 150–400)
RBC: 2.76 MIL/uL — AB (ref 3.87–5.11)
RBC: 3.54 MIL/uL — ABNORMAL LOW (ref 3.87–5.11)
RDW: 19.1 % — ABNORMAL HIGH (ref 11.5–15.5)
RDW: 19.6 % — AB (ref 11.5–15.5)
WBC: 5.4 10*3/uL (ref 4.0–10.5)
WBC: 7.3 10*3/uL (ref 4.0–10.5)

## 2015-12-15 LAB — BASIC METABOLIC PANEL
ANION GAP: 9 (ref 5–15)
BUN: 10 mg/dL (ref 6–20)
CALCIUM: 8 mg/dL — AB (ref 8.9–10.3)
CHLORIDE: 108 mmol/L (ref 101–111)
CO2: 23 mmol/L (ref 22–32)
CREATININE: 0.76 mg/dL (ref 0.44–1.00)
GFR calc non Af Amer: >60 mL/min (ref >60)
Glucose, Bld: 144 mg/dL — ABNORMAL HIGH (ref 65–99)
Potassium: 3.3 mmol/L — ABNORMAL LOW (ref 3.5–5.1)
SODIUM: 140 mmol/L (ref 135–145)

## 2015-12-15 LAB — RETICULOCYTES
RBC.: 2.98 MIL/uL — ABNORMAL LOW (ref 3.87–5.11)
Retic Count, Absolute: 59.6 K/uL (ref 19.0–186.0)
Retic Ct Pct: 2 % (ref 0.4–3.1)

## 2015-12-15 LAB — ABO/RH: ABO/RH(D): O POS

## 2015-12-15 LAB — PREPARE RBC (CROSSMATCH)

## 2015-12-15 MED ORDER — ONDANSETRON HCL 4 MG/2ML IJ SOLN: 4.0000 mg | Freq: Four times a day (QID) | INTRAMUSCULAR | Status: DC | PRN

## 2015-12-15 MED ORDER — SODIUM CHLORIDE 0.9 % IV SOLN: Freq: Once | INTRAVENOUS | Status: DC

## 2015-12-15 MED ORDER — ADULT MULTIVITAMIN W/MINERALS CH
1.0000 | ORAL_TABLET | Freq: Every day | ORAL | Status: DC
  Administered 2015-12-15: 1 via ORAL
  Filled 2015-12-15: qty 1

## 2015-12-15 MED ORDER — FERROUS FUMARATE 324 (106 FE) MG PO TABS
1.0000 | ORAL_TABLET | Freq: Two times a day (BID) | ORAL | Status: DC
  Administered 2015-12-15 (×2): 106 mg via ORAL
  Filled 2015-12-15 (×2): qty 1

## 2015-12-15 MED ORDER — ENSURE ENLIVE PO LIQD: 237.0000 mL | Freq: Two times a day (BID) | ORAL | 12 refills | Status: DC

## 2015-12-15 MED ORDER — SIMETHICONE 80 MG PO CHEW
160.0000 mg | CHEWABLE_TABLET | Freq: Four times a day (QID) | ORAL | Status: DC | PRN
  Administered 2015-12-15: 160 mg via ORAL
  Filled 2015-12-15: qty 2

## 2015-12-15 MED ORDER — DEXTROSE 5 % IV SOLN
1.0000 g | INTRAVENOUS | Status: DC
  Filled 2015-12-15: qty 10

## 2015-12-15 MED ORDER — ACETAMINOPHEN 650 MG RE SUPP
650.0000 mg | Freq: Four times a day (QID) | RECTAL | Status: DC | PRN
Start: 2015-12-15 — End: 2015-12-16

## 2015-12-15 MED ORDER — TRAMADOL HCL 50 MG PO TABS: 50.0000 mg | ORAL_TABLET | Freq: Four times a day (QID) | ORAL | Status: DC | PRN

## 2015-12-15 MED ORDER — ONDANSETRON HCL 4 MG PO TABS: 4.0000 mg | ORAL_TABLET | Freq: Four times a day (QID) | ORAL | Status: DC | PRN

## 2015-12-15 MED ORDER — ACETAMINOPHEN 325 MG PO TABS: 650.0000 mg | ORAL_TABLET | Freq: Four times a day (QID) | ORAL | Status: DC | PRN

## 2015-12-15 MED ORDER — POTASSIUM CHLORIDE CRYS ER 20 MEQ PO TBCR
40.0000 meq | EXTENDED_RELEASE_TABLET | Freq: Once | ORAL | Status: AC
  Administered 2015-12-15: 40 meq via ORAL
  Filled 2015-12-15: qty 2

## 2015-12-15 MED ORDER — SULFAMETHOXAZOLE-TRIMETHOPRIM 800-160 MG PO TABS: 1.0000 | ORAL_TABLET | Freq: Two times a day (BID) | ORAL | 0 refills | Status: DC

## 2015-12-15 MED ORDER — SULFAMETHOXAZOLE-TRIMETHOPRIM 800-160 MG PO TABS: 1.0000 | ORAL_TABLET | Freq: Two times a day (BID) | ORAL | Status: DC

## 2015-12-15 MED ORDER — SODIUM CHLORIDE 0.9% FLUSH
3.0000 mL | Freq: Two times a day (BID) | INTRAVENOUS | Status: DC
  Administered 2015-12-15: 3 mL via INTRAVENOUS

## 2015-12-15 MED ORDER — ENSURE ENLIVE PO LIQD: 237.0000 mL | Freq: Two times a day (BID) | ORAL | Status: DC

## 2015-12-15 MED ORDER — SULFAMETHOXAZOLE-TRIMETHOPRIM 800-160 MG PO TABS
1.0000 | ORAL_TABLET | Freq: Two times a day (BID) | ORAL | Status: DC
  Administered 2015-12-15: 1 via ORAL
  Filled 2015-12-15: qty 1

## 2015-12-15 NOTE — Discharge Instructions (Signed)
Antibiotic Medicine Antibiotic medicines are used to treat infections caused by bacteria. They work by injuring or killing the bacteria that is making you sick. HOW IS AN ANTIBIOTIC CHOSEN? An antibiotic is chosen based on many factors. To help your health care provider choose one for you, tell your health care provider if:  You have any allergies.  You are pregnant or plan to get pregnant.  You are breastfeeding.  You are taking any medicines. These include over-the-counter medicines, prescription medicines, and herbal remedies.  You have a medical condition or problem you have not already discussed. Your health care provider will also consider:  How often the medicine has to be taken.  Common side effects of the medicine.  The cost of the medicine.  The taste of the medicine. If you have questions about why an antibiotic was chosen, make sure to ask. FOR HOW LONG SHOULD I TAKE MY ANTIBIOTIC? Continue to take your antibiotic for as long as told by your health care provider. Do not stop taking it when you feel better. If you stop taking it too soon:  You may start to feel sick again.  Your infection may become harder to treat.  Complications may develop. WHAT IF I MISS A DOSE? Try not to miss any doses of medicine. If you miss a dose, take it as soon as possible. However, if it is almost time for the next dose:  If you are taking 2 doses per day, take the missed dose and the next dose 5 to 6 hours apart.  If you are taking 3 or more doses per day, take the missed dose and the next dose 2 to 4 hours apart, then go back to the normal schedule. If you cannot make up a missed dose, take the next scheduled dose on time. Then take the missed dose after you have taken all the doses as recommended by your health care provider, as if you had one more dose left. DO ANTIBIOTICS AFFECT BIRTH CONTROL? Birth control pills may not work while you are on antibiotics. If you are taking  birth control pills, continue taking them as usual and use a second form of birth control, such as a condom, to avoid unwanted pregnancy. Continue using the second form of birth control until you are finished with your current 1 month cycle of birth control pills. OTHER INFORMATION  If there is any medicine left over, throw it away.  Never take someone else's antibiotics.  Never take leftover antibiotics. SEEK MEDICAL CARE IF:  You get worse.  You do not feel better within a few days of starting the antibiotic medicine.  You vomit.  White patches appear in your mouth.  You have new joint pain that begins after starting the antibiotic.  You have new muscle aches that begin after starting the antibiotic.  You had a fever before starting the antibiotic and it returns.  You have any symptoms of an allergic reaction, such as an itchy rash. If this happens, stop taking the antibiotic. SEEK IMMEDIATE MEDICAL CARE IF:  Your urine turns dark or becomes blood-colored.  Your skin turns yellow.  You bruise or bleed easily.  You have severe diarrhea and abdominal cramps.  You have a severe headache.  You have signs of a severe allergic reaction, such as:  Trouble breathing.  Wheezing.  Swelling of the lips, tongue, or face.  Fainting.  Blisters on the skin or in the mouth. If you have signs of a severe  allergic reaction, stop taking the antibiotic right away.   This information is not intended to replace advice given to you by your health care provider. Make sure you discuss any questions you have with your health care provider.   Document Released: 12/24/2003 Document Revised: 01/01/2015 Document Reviewed: 08/28/2014 Elsevier Interactive Patient Education 2016 Elsevier Inc[employer] Abnormal Uterine Bleeding Abnormal uterine bleeding can affect women at various stages in life, including teenagers, women in their reproductive years, pregnant women, and women who have reached  menopause. Several kinds of uterine bleeding are considered abnormal, including:  Bleeding or spotting between periods.   Bleeding after sexual intercourse.   Bleeding that is heavier or more than normal.   Periods that last longer than usual.  Bleeding after menopause.  Many cases of abnormal uterine bleeding are minor and simple to treat, while others are more serious. Any type of abnormal bleeding should be evaluated by your health care provider. Treatment will depend on the cause of the bleeding. HOME CARE INSTRUCTIONS Monitor your condition for any changes. The following actions may help to alleviate any discomfort you are experiencing:  Avoid the use of tampons and douches as directed by your health care provider.  Change your pads frequently. You should get regular pelvic exams and Pap tests. Keep all follow-up appointments for diagnostic tests as directed by your health care provider.  SEEK MEDICAL CARE IF:   Your bleeding lasts more than 1 week.   You feel dizzy at times.  SEEK IMMEDIATE MEDICAL CARE IF:   You pass out.   You are changing pads every 15 to 30 minutes.   You have abdominal pain.  You have a fever.   You become sweaty or weak.   You are passing large blood clots from the vagina.   You start to feel nauseous and vomit. MAKE SURE YOU:   Understand these instructions.  Will watch your condition.  Will get help right away if you are not doing well or get worse.   This information is not intended to replace advice given to you by your health care provider. Make sure you discuss any questions you have with your health care provider.   Document Released: 04/12/2005 Document Revised: 04/17/2013 Document Reviewed: 11/09/2012 Elsevier Interactive Patient Education 2016 [employer].   Blood Transfusion  A blood transfusion is a procedure in which you receive donated blood through an IV tube. You may need a blood transfusion because of  illness, surgery, or injury. The blood may come from a donor, or it may be your own blood that you donated previously. The blood given in a transfusion is made up of different types of cells. You may receive:  Red blood cells. These carry oxygen and replace lost blood.  Platelets. These control bleeding.  Plasma. Thishelps blood to clot. If you have hemophilia or another clotting disorder, you may also receive other types of blood products. LET [facility] CARE PROVIDER KNOW ABOUT:  Any allergies you have.  All medicines you are taking, including vitamins, herbs, eye drops, creams, and over-the-counter medicines.  Previous problems you or members of your family have had with the use of anesthetics.  Any blood disorders you have.  Previous surgeries you have had.  Any medical conditions you may have.  Any previous reactions you have had during a blood transfusion.  RISKS AND COMPLICATIONS Generally, this is a safe procedure. However, problems may occur, including:  Having an allergic reaction to something in the donated blood.  Fever. This may be a reaction to the white blood cells in the transfused blood.  Iron overload. This can happen from having many transfusions.  Transfusion-related acute lung injury (TRALI). This is a rare reaction that causes lung damage. The cause is not known.TRALI can occur within hours of a transfusion or several days later.  Sudden (acute) or delayed hemolytic reactions. This happens if your blood does not match the cells in your transfusion. Your body's defense system (immune system) may try to attack the new cells. This complication is rare.  Infection. This is rare. BEFORE THE PROCEDURE  You may have a blood test to determine your blood type. This is necessary to know what kind of blood your body will accept.  If you are going to have a planned surgery, you may donate your own blood. This may be done in case you need to have a  transfusion.  If you have had an allergic reaction to a transfusion in the past, you may be given medicine to help prevent a reaction. Take this medicine only as directed by your health care provider.  You will have your temperature, blood pressure, and pulse monitored before the transfusion. PROCEDURE   An IV will be started in your hand or arm.  The bag of donated blood will be attached to your IV tube and given into your vein.  Your temperature, blood pressure, and pulse will be monitored regularly during the transfusion. This monitoring is done to detect early signs of a transfusion reaction.  If you have any signs or symptoms of a reaction, your transfusion will be stopped and you may be given medicine.  When the transfusion is over, your IV will be removed.  Pressure may be applied to the IV site for a few minutes.  A bandage (dressing) will be applied. The procedure may vary among health care providers and hospitals. AFTER THE PROCEDURE  Your blood pressure, temperature, and pulse will be monitored regularly.   This information is not intended to replace advice given to you by your health care provider. Make sure you discuss any questions you have with your health care provider.   Document Released: 04/09/2000 Document Revised: 05/03/2014 Document Reviewed: 02/20/2014 Elsevier Interactive Patient Education [employer].

## 2015-12-15 NOTE — Progress Notes (Signed)
Arrived from [place] from [place]. Alert and oriented. C/o of LLQ pain 4/10. Ambulated to BR. Oriented to room. Safety measure in place.

## 2015-12-15 NOTE — ED Notes (Signed)
Patient now states she is agreeable to go to [facility] via [place]. EDP notified.

## 2015-12-15 NOTE — Progress Notes (Signed)
Patient's IV removed. D/C paperwork reviewed. Nighttime meds given before pt left. Belongings with her and family member. Avrom Robarts, Rande Brunt, RN

## 2015-12-15 NOTE — Progress Notes (Signed)
Initial Nutrition Assessment  DOCUMENTATION CODES:   Non-severe (moderate) malnutrition in context of acute illness/injury  INTERVENTION:  Ensure Enlive po BID, each supplement provides 350 kcal and 20 grams of protein Encourage PO intake; frequent meals and snacks, and nutritional supplements daily   NUTRITION DIAGNOSIS:   Inadequate oral intake related to acute illness as evidenced by percent weight loss, energy intake < 75% for > 7 days, mild depletion of muscle mass, mild depletion of body fat.   GOAL:   Patient will meet greater than or equal to 90% of their needs   MONITOR:   PO intake, Supplement acceptance, Labs, Other (Comment), Skin  REASON FOR ASSESSMENT:   Malnutrition Screening Tool    ASSESSMENT:   65 y.o. female with medical history significant of untreated endometrial cancer presenting with having sypmtoms of UTI - "burning, loss of weight, loss of appetite".  Read website and "knew I had a UTI."  Dysuria x 2-3 days.  15 pounds weight loss since March.  +fever.  Pt reports that she has had a poor appetite due to her UTI for the past month, and has been eating 50% less than she usually does. She states that usually she maintains 165 lbs and eats small portions. She reports mostly drinking juice for the past month. She has lost from 165 lbs to 145 lbs. She has some mild muscle wasting in her calves and some mild fat wasting of her arms, but otherwise appears well nourished. She reports eating well today- 50% of her breakfast and lunch and 100% of a grilled chicken sandwich.  RD emphasized the importance of adequate, consistent, healthful PO intake. Provided tips for increasing calorie and protein intake to promote weight maintenance.  Labs: low hemoglobin, low RBC, low potassium, low calcium  Diet Order:  Diet regular Room service appropriate? Yes; Fluid consistency: Thin Diet general  Skin:  Reviewed, no issues  Last BM:  8/20  Height:   Ht Readings from  Last 1 Encounters:  12/14/15 5\' 1"  (1.549 m)    Weight:   Wt Readings from Last 1 Encounters:  12/14/15 145 lb 8 oz (66 kg)    Ideal Body Weight:  47.7 kg  BMI:  Body mass index is 27.49 kg/m.  Estimated Nutritional Needs:   Kcal:  1700-1900  Protein:  70-80 grams  Fluid:  1.7-1.9 L/day  EDUCATION NEEDS:   No education needs identified at this time  Scarlette Ar RD, LDN, CSP Inpatient Clinical Dietitian Pager: [phone] After Hours Pager: [phone]

## 2015-12-15 NOTE — ED Notes (Signed)
EDPA in to speak with pt/family.

## 2015-12-15 NOTE — H&P (Signed)
History and Physical    Kristen Greene DOB: 1950-10-03 DOA: 12/14/2015  PCP: No PCP Per Patient Consultants:  Marko Plume - onc; Terri Piedra - GYN Patient coming from: home - lives with 66yo great grandchild; NOK: Cristyn Stotler, [phone]  Chief Complaint: UTI  HPI: Kristen Greene is a 65 y.o. female with medical history significant of untreated endometrial cancer presenting with having sypmtoms of UTI - "burning, loss of weight, loss of appetite".  Read website and "knew I had a UTI."  Dysuria x 2-3 days.  15 pounds weight loss since March.  +fever.  Also dealing with iron deficiency.  Had no idea it had dropped so low.  She does have SOB and heart palpitations which are exacerbated with exertion.  Has endometrial cancer which she describes as "a bad set of ovaries and I don't want to be cut to get them out."  Bleeding since February - every day, max of 16 pads in a day.  Takes iron - usually doesn't miss it but got very weak last week and missed 3-4 days.  Also takes a "blood builder" which comes from a vitamin shop.  Has known about cancer for "years", diagnosed by Dr. Theda Sers - "she said most women have them but when you go through the change they shrink."    In review of Dr. Mariana Kaufman notes, the patient has repeatedly refused all offered therapies including surgery, chemotherapy, and radiation.  She has been referred to palliative care at least twice and has cancelled those appointments.  The patient also refused palliative care referral now and reports that she is planning to see a homeopathic doctor because a friend who had prostate cancer went there when he refused traditional treatments and he did well.   She would consider resection of the tumor "by laser" but not surgically and is not willing to have a GYN consult as an inpatient; she will f/u with Dr. Terri Piedra as an outpatient.   ED Course: Per PA-C [facility]: 65 year old female presents today with complaints of urinary tract  infection. Patient also reports complaints of fatigue. She is significantly anemic here 5.8, this is down from atrial fibrillation may and 9 in March. She has a history of endometrial cancer, refusing therapy. Patient blood loss secondary to vaginal bleeding. Patient reports that she would like to receive blood transfusion, but is adamant about invasive intervention including surgery.  Patient will need hospital admission for blood transfusion. Dr. Loleta Books consult and who agreed to accept patient, patient refused caroling transport and requested to go POV. She was given a dose of ceftriaxone and fluid here in the ED. After reassessment patient would like to go via [facility]. Patient's blood pressure has remained stable, reassuring vital signs. She is in no acute distress, will be transferred to [facility] for further evaluation and management.  Review of Systems: As per HPI; otherwise 10 point review of systems reviewed and negative.   Ambulatory Status:  Ambulates without assistance  Past Medical History:  Diagnosis Date  . Diabetes mellitus without complication (HCC)    borderline  . Endometrial cancer ([place]) dx'd 04/2015   FIGO [place] IIIB  . Fibroids   . Reflux   . Sickle-cell trait [facility])     Past Surgical History:  Procedure Laterality Date  . DILATATION & CURETTAGE/HYSTEROSCOPY WITH MYOSURE N/A 05/14/2015   Procedure: DILATATION & CURETTAGE/HYSTEROSCOPY ;  Surgeon: Sherlyn Hay, DO;  Location: [facility];  Service: Gynecology;  Laterality: N/A;  . LEEP N/A 05/14/2015  Procedure: LOOP ELECTROSURGICAL EXCISION PROCEDURE (LEEP);  Surgeon: Sherlyn Hay, DO;  Location: [facility];  Service: Gynecology;  Laterality: N/A;  . TONSILLECTOMY    . TUBAL LIGATION      Social History   Social History  . Marital status: Divorced    Spouse name: N/A  . Number of children: N/A  . Years of education: N/A   Occupational History  . Not on file.   Social History Main Topics  . Smoking  status: Former Research scientist (life sciences)  . Smokeless tobacco: Never Used  . Alcohol use No  . Drug use: No  . Sexual activity: Not on file   Other Topics Concern  . Not on file   Social History Narrative  . No narrative on file    Allergies  Allergen Reactions  . Feraheme [Ferumoxytol] Shortness Of Breath    Dyspnea, cough and chest tightness during second infusion.    Family History  Problem Relation Age of Onset  . Diabetes Mother   . CAD Father 56    Prior to Admission medications   Medication Sig Start Date End Date Taking? Authorizing Provider  ferrous fumarate (HEMOCYTE - 106 MG FE) 325 (106 Fe) MG TABS tablet Take 1 tablet daily on an Tempty stomach  With OJ or Vitamin C  tablet. Patient taking differently: Take 1 tablet daily on an empty stomach  With OJ or Vitamin C  tablet. 06/11/15  Yes Lennis Marion Downer, MD  Multiple Vitamin (THERA) TABS Take by mouth.   Yes Historical Provider, MD  dexamethasone (DECADRON) 4 MG tablet Take 5 tablets with food 12 hrs and 6 hrs prior to Taxol Chemotherapy. Patient not taking: Reported on 07/07/2015 06/23/15   Gordy Levan, MD  loratadine (EQ LORATADINE) 10 MG tablet Take 1 tablet (10 mg total) by mouth daily. 06/24/15   Lennis Marion Downer, MD  LORazepam (ATIVAN) 0.5 MG tablet Take 1 tablet (0.5 mg total) by mouth every 8 (eight) hours. Sublingual or by mouth. Will make drowsy. Patient not taking: Reported on 06/23/2015 06/06/15   Gordy Levan, MD  naproxen sodium (ANAPROX) 220 MG tablet Take 220 mg by mouth 2 (two) times daily as needed. Reported on 09/15/2015    Historical Provider, MD  ondansetron (ZOFRAN) 8 MG tablet Take 1 tablet (8 mg total) by mouth every 8 (eight) hours as needed for nausea or vomiting. Will not make drowsy. Patient not taking: Reported on 06/23/2015 06/06/15   Gordy Levan, MD    Physical Exam: Vitals:   12/15/15 0000 12/15/15 0148 12/15/15 0216 12/15/15 0315  BP: 112/65 128/69  131/73  Pulse: 99 94  100  Resp:  20  18    Temp:   99.9 F (37.7 C) 99.3 F (37.4 C)  TempSrc:   Oral Oral  SpO2: 98% 100%  100%  Weight:      Height:         General: Appears calm and comfortable and is NAD Eyes:  PERRL, EOMI, normal lids, iris ENT:  grossly normal hearing, lips & tongue, mmm Neck:  no LAD, masses or thyromegaly Cardiovascular:  RRR, no m/r/g. No LE edema.  Respiratory:  CTA bilaterally, no w/r/r. Normal respiratory effort. Abdomen:  soft, ntnd, NABS Skin:  no rash or induration seen on limited exam Musculoskeletal:  grossly normal tone BUE/BLE, good ROM, no bony abnormality Psychiatric:  grossly normal mood and affect, speech fluent and appropriate, AOx3 Neurologic:  CN 2-12 grossly intact, moves all extremities in  coordinated fashion, sensation intact  Labs on Admission: I have personally reviewed following labs and imaging studies  CBC:  Recent Labs Lab 12/14/15 1909  WBC 6.1  NEUTROABS 4.4  HGB 5.8*  HCT 20.4*  MCV 70.8*  PLT 123XX123   Basic Metabolic Panel:  Recent Labs Lab 12/14/15 1909  NA 138  K 3.5  CL 105  CO2 24  GLUCOSE 123*  BUN 15  CREATININE 0.89  CALCIUM 8.2*   GFR: Estimated Creatinine Clearance: 54.8 mL/min (by C-G formula based on SCr of 0.89 mg/dL). Liver Function Tests: No results for input(s): AST, ALT, ALKPHOS, BILITOT, PROT, ALBUMIN in the last 168 hours. No results for input(s): LIPASE, AMYLASE in the last 168 hours. No results for input(s): AMMONIA in the last 168 hours. Coagulation Profile: No results for input(s): INR, PROTIME in the last 168 hours. Cardiac Enzymes: No results for input(s): CKTOTAL, CKMB, CKMBINDEX, TROPONINI in the last 168 hours. BNP (last 3 results) No results for input(s): PROBNP in the last 8760 hours. HbA1C: No results for input(s): HGBA1C in the last 72 hours. CBG: No results for input(s): GLUCAP in the last 168 hours. Lipid Profile: No results for input(s): CHOL, HDL, LDLCALC, TRIG, CHOLHDL, LDLDIRECT in the last 72  hours. Thyroid Function Tests: No results for input(s): TSH, T4TOTAL, FREET4, T3FREE, THYROIDAB in the last 72 hours. Anemia Panel:  Recent Labs  12/14/15 1909  RETICCTPCT 2.0   Urine analysis:    Component Value Date/Time   COLORURINE AMBER (A) 12/14/2015 1505   APPEARANCEUR CLOUDY (A) 12/14/2015 1505   LABSPEC 1.024 12/14/2015 1505   PHURINE 5.5 12/14/2015 1505   GLUCOSEU NEGATIVE 12/14/2015 1505   HGBUR LARGE (A) 12/14/2015 1505   BILIRUBINUR MODERATE (A) 12/14/2015 1505   KETONESUR 15 (A) 12/14/2015 1505   PROTEINUR 100 (A) 12/14/2015 1505   NITRITE NEGATIVE 12/14/2015 1505   LEUKOCYTESUR MODERATE (A) 12/14/2015 1505    Creatinine Clearance: Estimated Creatinine Clearance: 54.8 mL/min (by C-G formula based on SCr of 0.89 mg/dL).  Sepsis Labs: @LABRCNTIP (procalcitonin:4,lacticidven:4) )No results found for this or any previous visit (from the past 240 hour(s)).   Radiological Exams on Admission: No results found.  EKG: Not done  Assessment/Plan Principal Problem:   Symptomatic anemia Active Problems:   Endometrial cancer, FIGO stage IIIB (HCC)   Iron deficiency anemia due to chronic blood loss   H/O noncompliance with medical treatment, presenting hazards to health   UTI (urinary tract infection)   Symptomatic anemia -Patient with h/o iron deficient anemia presenting with chronic daily vaginal bleeding with up to 16 pads/day whose Hgb is now 5.8 -Patient does not require additional evaluation of anemia as it seems obvious why she has it -Will transfuse with 2 units PRBC - patient consented and voices understanding and agreement with risks which are likely much less than the benefits she will achieve -Will check post-transfusion CBC  UTI -Patient with dysuria and fever -No current sepsis physiology; had tachycardia which improved with IVF but no documented fever, normal WBC count, normal lactate -Will continue Rocephin for now  Endometrial cancer with poor  compliance -Patient has apparent delusional beliefs about her reproductive system - they were fixed beliefs and she did not seem open to discussion about her fixed beliefs -Unfortunately, several of the things that she explained were clearly false and may be contributing to her poor decisions -She seemed interested in a "laser" procedure for resection of the endometrial tumor but was unwilling to discuss this via GYN consult  and prefers to discuss with her GYN Dr. Terri Piedra (but has h/o non-CPL with appointments) -She voices plan to see a homeopath for treatment but not willingness to treat with chemo/rads -She has been referred to palliative care twice and has not gone, but is not currently interested in palliative care consultation here -She is hospice appropriate, in all likelihood, and this should probably be offered during or shortly after this hospitalization -With weight loss and now persistent vaginal bleeding which is worse than documented by Dr. Marko Plume at the patient's last appointment in May, her prognosis is extremely poor -Psychiatry consultation may be useful    DVT prophylaxis: SCDs Code Status:  DNR - confirmed with patient Family Communication: None present - prior note from Dr. Marko Plume indicated that they may not know the decisions the patient is making; the patient told me that "they know I will make my own decisions" Disposition Plan:  Home once clinically improved Consults called: None  Admission status: Observation, telemetry    Karmen Bongo MD [facility]  If 7PM-7AM, please contact night-coverage [email or web address] Password [email or web address]  12/15/2015, 4:53 AM

## 2015-12-15 NOTE — Discharge Summary (Addendum)
Physician Discharge Summary  [name] [id number] DOB: 12-28-1950 DOA: 12/14/2015  PCP: No PCP Per Patient  Admit date: 12/14/2015 Discharge date: 12/15/2015  Admitted From: Home Discharge disposition: Home   Recommendations for Outpatient Follow-Up:   1. F/U with Dr. Marko Plume in 2 days for repeat CBC.   Discharge Diagnosis:   Principal Problem:    Symptomatic anemia Active Problems:    Endometrial cancer, FIGO stage IIIB (HCC)    Iron deficiency anemia due to chronic blood loss    H/O noncompliance with medical treatment, presenting hazards to health    UTI (urinary tract infection)    Moderate malnutrition  Discharge Condition: Improved.  Diet recommendation: Regular.   History of Present Illness:   Kristen Greene is an 65 y.o. female with medical history significant of untreated endometrial cancer who was admitted 12/14/15 with chief complaint of dysuria, loss of weight, loss of appetite, menorrhagia and fever. In review of Dr. Mariana Kaufman notes, the patient has repeatedly refused all offered therapies including surgery, chemotherapy, and radiation. She has been referred to palliative care at least twice and has cancelled those appointments. She would consider resection of the tumor "by laser" but not surgically and is not willing to have a GYN consult as an inpatient; she will f/u with Dr. Terri Piedra as an outpatient. Hemoglobin on presentation was 5.8 mg/dL.   Hospital Course by Problem:   Principal Problem:   Symptomatic anemia/Iron deficiency anemia due to chronic blood loss Patient is being given 2 units of blood. She feels well and does not want any further inpatient workup and therefore can be discharged home after the second unit with close follow-up with Dr. Marko Plume for repeat hemoglobin/hematocrit.  Active Problems:   Endometrial cancer, FIGO stage IIIB (HCC) / H/O noncompliance with medical treatment, presenting hazards to health Poor prognosis with  poor healthcare literacy and refusal to comply with treatment recommendations. Patient is competent to make her own decisions and simply refuses care opting for alternative treatment modalities.    Hypokalemia  Supplemented.    UTI (urinary tract infection) Given a dose of Rocephin, will continue Septra DS at discharge for 3 days.    Moderate malnutrition Seen by dietician. Ensure supplements ordered.    Medical Consultants:    None.   Discharge Exam:   Vitals:   12/15/15 1632 12/15/15 1843  BP: 129/72 138/82  Pulse: 98 95  Resp: 17 17  Temp: 99.5 F (37.5 C) 99.7 F (37.6 C)   Vitals:   12/15/15 1420 12/15/15 1545 12/15/15 1632 12/15/15 1843  BP: 131/78 109/86 129/72 138/82  Pulse: 88 99 98 95  Resp: 17 17 17 17   Temp: 98.9 F (37.2 C) 99.6 F (37.6 C) 99.5 F (37.5 C) 99.7 F (37.6 C)  TempSrc: Oral Oral Oral Oral  SpO2: 99% 99% 99% 99%  Weight:      Height:        General exam: Appears calm and comfortable.  Respiratory system: Clear to auscultation. Respiratory effort normal. Cardiovascular system: S1 & S2 heard, RRR. No JVD,  rubs, gallops or clicks. No murmurs. Gastrointestinal system: Abdomen is nondistended, soft and nontender. No organomegaly or masses felt. Normal bowel sounds heard. Central nervous system: Alert and oriented. No focal neurological deficits. Extremities: No clubbing,  or cyanosis. No edema. Skin: No rashes, lesions or ulcers. Psychiatry: Judgement and insight appear normal. Mood & affect appropriate.    The results of significant diagnostics from this hospitalization (including imaging, microbiology, ancillary and laboratory)  are listed below for reference.     Procedures and Diagnostic Studies:   No results found.   Labs:   Basic Metabolic Panel:  Recent Labs Lab 12/14/15 1909 12/15/15 0539  NA 138 140  K 3.5 3.3*  CL 105 108  CO2 24 23  GLUCOSE 123* 144*  BUN 15 10  CREATININE 0.89 0.76  CALCIUM 8.2* 8.0*    GFR Estimated Creatinine Clearance: 61 mL/min (by C-G formula based on SCr of 0.8 mg/dL). Liver Function Tests: No results for input(s): AST, ALT, ALKPHOS, BILITOT, PROT, ALBUMIN in the last 168 hours. No results for input(s): LIPASE, AMYLASE in the last 168 hours. No results for input(s): AMMONIA in the last 168 hours. Coagulation profile No results for input(s): INR, PROTIME in the last 168 hours.  CBC:  Recent Labs Lab 12/14/15 1909 12/15/15 0539  WBC 6.1 5.4  NEUTROABS 4.4  --   HGB 5.8* 5.5*  HCT 20.4* 19.7*  MCV 70.8* 71.4*  PLT 399 307   Anemia work up  Recent Labs  12/14/15 1909  RETICCTPCT 2.0   Microbiology No results found for this or any previous visit (from the past 240 hour(s)).   Discharge Instructions:   Discharge Instructions    Call MD for:  difficulty breathing, headache or visual disturbances    Complete by:  As directed   Call MD for:  extreme fatigue    Complete by:  As directed   Call MD for:  persistant dizziness or light-headedness    Complete by:  As directed   Call MD for:  severe uncontrolled pain    Complete by:  As directed   Diet general    Complete by:  As directed   Increase activity slowly    Complete by:  As directed       Medication List    TAKE these medications   feeding supplement (ENSURE ENLIVE) Liqd Take 237 mLs by mouth 2 (two) times daily between meals. Start taking on:  12/16/2015   ferrous fumarate 325 (106 Fe) MG Tabs tablet Commonly known as:  HEMOCYTE - 106 mg FE Take 1 tablet daily on an Tempty stomach  With OJ or Vitamin C  tablet. What changed:  additional instructions   sulfamethoxazole-trimethoprim 800-160 MG tablet Commonly known as:  BACTRIM DS,SEPTRA DS Take 1 tablet by mouth 2 (two) times daily.   THERA Tabs Take by mouth.      Follow-up Information    Evlyn Clines, MD. Schedule an appointment as soon as possible for a visit today.   Specialty:  Oncology Why:  To re-check your blood  counts. Contact information: [place] [address] [phone]            Time coordinating discharge: 25 minutes.  Signed:  Taylar Hartsough  Pager [phone] [facility] 12/15/2015, 6:54 PM

## 2015-12-15 NOTE — Care Management Note (Signed)
Case Management Note  Patient Details  Name: Kristen Greene MRN: [id number] Date of Birth: 01/08/1951  Subjective/Objective:   Pt admitted with UTI                 Action/Plan:  PTA pt independent.  CM assessed pt prior discharge - no CM needs determine   Expected Discharge Date:                  Expected Discharge Plan:  Home/Self Care  In-House Referral:     Discharge planning Services  CM Consult  Post Acute Care Choice:    Choice offered to:     DME Arranged:    DME Agency:     HH Arranged:    HH Agency:     Status of Service:  Completed, signed off  If discussed at [employer] of Stay Meetings, dates discussed:    Additional Comments:  Maryclare Labrador, RN 12/15/2015, 3:47 PM

## 2015-12-16 LAB — URINE CULTURE

## 2015-12-16 LAB — TYPE AND SCREEN
ABO/RH(D): O POS
Antibody Screen: NEGATIVE
UNIT DIVISION: 0
Unit division: 0

## 2015-12-20 ENCOUNTER — Other Ambulatory Visit: Payer: Self-pay | Admitting: Oncology

## 2015-12-20 DIAGNOSIS — D500 Iron deficiency anemia secondary to blood loss (chronic): Secondary | ICD-10-CM

## 2015-12-22 ENCOUNTER — Ambulatory Visit (HOSPITAL_BASED_OUTPATIENT_CLINIC_OR_DEPARTMENT_OTHER): Payer: PPO | Admitting: Oncology

## 2015-12-22 ENCOUNTER — Encounter: Payer: Self-pay | Admitting: Oncology

## 2015-12-22 ENCOUNTER — Ambulatory Visit: Payer: PPO | Admitting: Oncology

## 2015-12-22 ENCOUNTER — Ambulatory Visit (HOSPITAL_COMMUNITY)
Admission: RE | Admit: 2015-12-22 | Discharge: 2015-12-22 | Disposition: A | Discharge status: Home / Self Care | Payer: PPO | Source: Ambulatory Visit | Attending: Oncology | Admitting: Oncology

## 2015-12-22 ENCOUNTER — Other Ambulatory Visit: Payer: Self-pay | Admitting: Oncology

## 2015-12-22 ENCOUNTER — Other Ambulatory Visit (HOSPITAL_BASED_OUTPATIENT_CLINIC_OR_DEPARTMENT_OTHER): Payer: PPO

## 2015-12-22 ENCOUNTER — Other Ambulatory Visit: Payer: PPO

## 2015-12-22 VITALS — BP 128/76 | HR 94 | Temp 98.7°F | Resp 18 | Ht 61.0 in | Wt 149.8 lb

## 2015-12-22 DIAGNOSIS — C541 Malignant neoplasm of endometrium: Secondary | ICD-10-CM

## 2015-12-22 DIAGNOSIS — D573 Sickle-cell trait: Secondary | ICD-10-CM

## 2015-12-22 DIAGNOSIS — D649 Anemia, unspecified: Secondary | ICD-10-CM | POA: Insufficient documentation

## 2015-12-22 DIAGNOSIS — D509 Iron deficiency anemia, unspecified: Secondary | ICD-10-CM | POA: Diagnosis not present

## 2015-12-22 DIAGNOSIS — Z9119 Patient's noncompliance with other medical treatment and regimen: Secondary | ICD-10-CM

## 2015-12-22 DIAGNOSIS — R300 Dysuria: Secondary | ICD-10-CM

## 2015-12-22 DIAGNOSIS — D500 Iron deficiency anemia secondary to blood loss (chronic): Secondary | ICD-10-CM

## 2015-12-22 DIAGNOSIS — Z66 Do not resuscitate: Secondary | ICD-10-CM

## 2015-12-22 DIAGNOSIS — N939 Abnormal uterine and vaginal bleeding, unspecified: Secondary | ICD-10-CM

## 2015-12-22 DIAGNOSIS — Z91199 Patient's noncompliance with other medical treatment and regimen due to unspecified reason: Secondary | ICD-10-CM

## 2015-12-22 LAB — CBC WITH DIFFERENTIAL/PLATELET
BASO%: 0.3 % (ref 0.0–2.0)
BASOS ABS: 0 10*3/uL (ref 0.0–0.1)
EOS ABS: 0.1 10*3/uL (ref 0.0–0.5)
EOS%: 1.3 % (ref 0.0–7.0)
HCT: 25.9 % — ABNORMAL LOW (ref 34.8–46.6)
HGB: 7.8 g/dL — ABNORMAL LOW (ref 11.6–15.9)
LYMPH%: 11.7 % — AB (ref 14.0–49.7)
MCH: 22 pg — AB (ref 25.1–34.0)
MCHC: 30.1 g/dL — AB (ref 31.5–36.0)
MCV: 73.2 fL — AB (ref 79.5–101.0)
MONO#: 0.6 10*3/uL (ref 0.1–0.9)
MONO%: 8.4 % (ref 0.0–14.0)
NEUT%: 78.3 % — AB (ref 38.4–76.8)
NEUTROS ABS: 5.3 10*3/uL (ref 1.5–6.5)
Platelets: 365 10*3/uL (ref 145–400)
RBC: 3.54 10*6/uL — AB (ref 3.70–5.45)
RDW: 21.4 % — ABNORMAL HIGH (ref 11.2–14.5)
WBC: 6.8 10*3/uL (ref 3.9–10.3)
lymph#: 0.8 10*3/uL — ABNORMAL LOW (ref 0.9–3.3)

## 2015-12-22 LAB — URINALYSIS, MICROSCOPIC - CHCC
Bilirubin (Urine): NEGATIVE
GLUCOSE UR CHCC: NEGATIVE mg/dL
KETONES: NEGATIVE mg/dL
Nitrite: NEGATIVE
PROTEIN: 30 mg/dL
SPECIFIC GRAVITY, URINE: 1.025 (ref 1.003–1.035)
UROBILINOGEN UR: 0.2 mg/dL (ref 0.2–1)
pH: 5 (ref 4.6–8.0)

## 2015-12-22 LAB — COMPREHENSIVE METABOLIC PANEL
ALT: <9 U/L (ref 0–55)
ANION GAP: 11 meq/L (ref 3–11)
AST: 14 U/L (ref 5–34)
Albumin: 2.9 g/dL — ABNORMAL LOW (ref 3.5–5.0)
Alkaline Phosphatase: 80 U/L (ref 40–150)
BUN: 9.3 mg/dL (ref 7.0–26.0)
CHLORIDE: 106 meq/L (ref 98–109)
CO2: 25 meq/L (ref 22–29)
Calcium: 9.2 mg/dL (ref 8.4–10.4)
Creatinine: 0.8 mg/dL (ref 0.6–1.1)
GLUCOSE: 111 mg/dL (ref 70–140)
POTASSIUM: 4.1 meq/L (ref 3.5–5.1)
SODIUM: 142 meq/L (ref 136–145)
Total Bilirubin: <0.3 mg/dL (ref 0.20–1.20)
Total Protein: 7.7 g/dL (ref 6.4–8.3)

## 2015-12-22 LAB — IRON AND TIBC
%SAT: 8 % — AB (ref 21–57)
Iron: 20 ug/dL — ABNORMAL LOW (ref 41–142)
TIBC: 263 ug/dL (ref 236–444)
UIBC: 243 ug/dL (ref 120–384)

## 2015-12-22 LAB — FERRITIN: Ferritin: 53 ng/ml (ref 9–269)

## 2015-12-22 LAB — ABO/RH: ABO/RH(D): O POS

## 2015-12-22 MED ORDER — NITROFURANTOIN MONOHYD MACRO 100 MG PO CAPS: 100.0000 mg | ORAL_CAPSULE | Freq: Two times a day (BID) | ORAL | 0 refills | Status: AC

## 2015-12-22 NOTE — Progress Notes (Signed)
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 She has ongoing bleeding from the endometrial cancer, with admission to [facility] 8-20 thru 12-15-15 due to Hgb 5.8, transfused 2 units PRBCs then.   Patient did not keep follow up with radiation oncology as recommended when I saw her last , visit set up to allow palliative RT during time that her great grandson was not staying with her. She had previously refused chemotherapy, was not surgical candidate and also did not allow palliative care to meet with her after that home visit coordinated.  Patient tells me several times now that she simply does not want chemotherapy or radiation. We certainly will respect her decision, but still need to have enough care that she can avoid ED and hospitalizations if possible.   Patient tells me that she was almost too weak to walk when hemoglobin was 5.8, and has felt significantly better since transfusion of 2 units PRBCs 8-20 to 12-15-15. Note she declined any further evaluation during that hospitalization. She has continuous vaginal bleeding, at times "heavy". She denies other bleeding. She reports that she continues to take oral iron. She denies increased SOB or chest pain now. Appetite is good and bowels move regularly. She denies abdominal or pelvic pain or worsening bladder symptoms, note urine culture in hospital multiple species. She has no increased swelling LE. She has had no fever or symptoms of infection.  Remainder of 10 point Review of Systems negative  No central catheter No genetics testing Refused flu vaccine Feraheme given 06-26-15 without difficulty, then reaction to second feraheme   ONCOLOGIC HISTORY Patient presented in early Dec 2016 with  intermittent postmenopausal bleeding x 1-2 years; last PAP >10 years prior. Dr Mickle Mallory obtained PAP 03-31-15 with HGSIL and [place] with thickened endometrium and intrauterine mass. She had D&C and LEEP 05-14-15, with pathology ([id number]) predominately high grade serous carcinoma with focal endometrioid adenocarcinoma, extensively involving cervix, LVSI present, only focal areas of residual benign squamous epithelium at cervix. She saw Dr Denman George on 05-30-15, with exam noting encroachment of tumor onto the left anterior vaginal fornix, cervix 5 cm, friable, grossly replaced with tumor, uterus bulky with enlarged lower uterine segment and no apparent parametrial extension. Initial recommendation was for staging CT CAP and likely for initial chemoradiation + brachytherapy, then, if responding well to treatment, to have extrafascial hysterectomy 6 weeks out from completion of radiation, followed by additional 4 cycles of adjuvant carboplatin taxol. CT CAP 06-03-2015 showed no metastatic disease in chest or other organs, a 7.4 x 4.0 x 6.3 cm mass involving uterine corpus and cervix, and abutting anterior wall of rectum, some adenopathy retroperitoneal/ bilateral iliac, right inguinal regions. She saw Dr Sondra Come in consultation on 06-04-15, however subsequently cancelled simulation at least x2. Case was presented at gyn oncology multidisciplinary conference with recommendation to proceed with initial carboplatin taxol chemotherapy x 3-4 cycles, then consider additional chemotherapy vs surgery, possibly with radiation at completion if good response.  As of 12-22-15, patient has refused any chemotherapy or radiation therapy.  She had first feraheme on 06-26-15 without difficulty, then reaction shortly after start of second dose on 07-07-15.    Objective:  Vital signs in last 24 hours:  BP 128/76 (BP Location:  Left Arm, Patient Position: Sitting)   Pulse 94   Temp 98.7 F (37.1 C) (Oral)   Resp 18   Ht 5\' 1"  (1.549 m)   Wt  149 lb 12.8 oz (67.9 kg)   SpO2 100%   BMI 28.30 kg/m  Weight down from 164 lbs 08-2015. Not in acute discomfort, respirations not labored RA.  Alert, oriented and appropriate, cooperative, neatly dressed.. Uses WC for office, in part from chronic orthopedic problems LE  HEENT:PERRL, sclerae not icteric. Oral mucosa moist without lesions, posterior pharynx clear.  Neck supple. No JVD. Mucous membranes pale. Wearing wig Lymphatics:no cervical,supraclavicular,adenopathy Resp: clear to auscultation bilaterally  Cardio: regular rate and rhythm. No gallop. GI: soft, nontender, not distended, no mass or organomegaly. Normally active bowel sounds.  Musculoskeletal/ Extremities: without pitting edema, cords, tenderness Neuro: no peripheral neuropathy. Otherwise nonfocal. PSYCH  Appropriate mood and affect Skin without rash, ecchymosis, petechiae   Lab Results:  Results for orders placed or performed in visit on 12/22/15  CBC with Differential  Result Value Ref Range   WBC 6.8 3.9 - 10.3 10e3/uL   NEUT# 5.3 1.5 - 6.5 10e3/uL   HGB 7.8 (L) 11.6 - 15.9 g/dL   HCT 12/24/15 (L) 54.9 - 24.4 %   Platelets 365 145 - 400 10e3/uL   MCV 73.2 (L) 79.5 - 101.0 fL   MCH 22.0 (L) 25.1 - 34.0 pg   MCHC 30.1 (L) 31.5 - 36.0 g/dL   RBC 31.1 (L) 1.23 - 7.44 10e6/uL   RDW 21.4 (H) 11.2 - 14.5 %   lymph# 0.8 (L) 0.9 - 3.3 10e3/uL   MONO# 0.6 0.1 - 0.9 10e3/uL   Eosinophils Absolute 0.1 0.0 - 0.5 10e3/uL   Basophils Absolute 0.0 0.0 - 0.1 10e3/uL   NEUT% 78.3 (H) 38.4 - 76.8 %   LYMPH% 11.7 (L) 14.0 - 49.7 %   MONO% 8.4 0.0 - 14.0 %   EOS% 1.3 0.0 - 7.0 %   BASO% 0.3 0.0 - 2.0 %  Comprehensive metabolic panel  Result Value Ref Range   Sodium 142 136 - 145 mEq/L   Potassium 4.1 3.5 - 5.1 mEq/L   Chloride 106 98 - 109 mEq/L   CO2 25 22 - 29 mEq/L   Glucose 111 70 - 140 mg/dl   BUN 9.3 7.0 - 8.41 mg/dL   Creatinine 0.8 0.6 - 1.1 mg/dL   Total Bilirubin 67.0 0.20 - 1.20 mg/dL   Alkaline Phosphatase 80  40 - 150 U/L   AST 14 5 - 34 U/L   ALT <9 0 - 55 U/L   Total Protein 7.7 6.4 - 8.3 g/dL   Albumin 2.9 (L) 3.5 - 5.0 g/dL   Calcium 9.2 8.4 - <5.89 mg/dL   Anion Gap 11 3 - 11 mEq/L   EGFR >90 >90 ml/min/1.73 m2  Iron and TIBC  Result Value Ref Range   Iron 20 (L) 41 - 142 ug/dL   TIBC 10.4 740 - 457 ug/dL   UIBC 635 818 - 256 ug/dL   %SAT 8 (L) 21 - 57 %  Ferritin  Result Value Ref Range   Ferritin 53 9 - 269 ng/ml  Urinalysis with microscopic  Result Value Ref Range   Glucose Negative Negative mg/dL   Bilirubin (Urine) Negative Negative   Ketones Negative Negative mg/dL   Specific Gravity, Urine 1.025 1.003 - 1.035   Blood Large Negative   pH 5.0 4.6 - 8.0   Protein 30 Negative- <30 mg/dL  Urobilinogen, UR 0.2 0.2 - 1 mg/dL   Nitrite Negative Negative   Leukocyte Esterase Moderate Negative   RBC / HPF TNTC 0 - 2   WBC, UA 21-50 0 - 2   Bacteria, UA Moderate Negative- Trace   Epithelial Cells Occasional Negative- Few     Studies/Results:  No results found.  Medications: I have reviewed the patient's current medications.  DISCUSSION Interval history reviewed. Patient tells me that her granddaughter, mother of the 44 yo great grandson, lives with them and is employed. I have asked patient directly if there are concerns such as drugs or other social constraints that are impacting her decisions, which she denies. She tells me that she does not know what to say to the child about her illness and eventual death.   As above, patient does not want any attempt at palliative treatment of the endometrial cancer, including no chemo and no radiation. I have told her that we will certainly respect her decision, however we still need to have some ongoing care for symptom management and to try to avoid ED/ hospitalization if possible. I have recommended Hospice; patient states agreement with referral. She is particularly interested in [place] program for her 60 yo great grandson.  We have  discussed her wishes as to resuscitation or life support: she does not want either resuscitation or life support, DNR documented in problem list in EMR  Patient had type and hold drawn with other labs today, in anticipation of possible transfusion. Tho she feels better than with hgb in 5's, she is still weak, has ongoing bleeding and would like transfusion set up in next day or so coordinating time with her child care responsibilities. We would only consider further transfusions based on situation when under Hospice care.     Assessment/Plan: 1.high grade serous endometrial carcinoma, extensively involving cervix and upper left vaginal fornix, with adenopathy on CT (retroperitoneal, bilateral iliac, right inguinal) and involvement posterior to uterus possibly direct extension vs adnexal 05-2015:   recommendation was for [place] taxol as initial intervention, possible surgery or radiation after completion depending on response (or RT sooner if heavy bleeding). She declines any intervention and we will respect that decision.  She now agrees with Hospice referral, particularly to support the young great grandson.; I am glad to be attending for Hospice. She is certainly at high risk for significant complications and symptoms, note recent hospitalization for symptomatic blood loss anemia. No PCP.  2.iron deficiency anemia: Iron studies still low, ongoing bleeding, refused RT to palliate the bleeding, reacted to second IV iron. WIll transfuse additional 2 units PRBCs 12-23-15 3.sickle cell trait 4.flu vaccine refused previously 5.social concerns: significant family concerns. I do not think her sons or the niece know of the cancer diagnosis. Young great grandson lives with patient. Loss of daughter at age 14 after lifelong illness. 6.chronic orthopedic problems legs 7.past tobacco 8.BTL, tonsillectomy 9.Confirmed DNR with patient now.    All questions answered. Transfusion orders placed. TIme spent 25 min  including >50% counseling and coordination of care. I will see her back prn if we are able to get Hospice.     Evlyn Clines, MD   12/22/2015, 8:19 PM

## 2015-12-23 ENCOUNTER — Telehealth: Payer: Self-pay

## 2015-12-23 ENCOUNTER — Encounter: Payer: Self-pay | Admitting: Oncology

## 2015-12-23 ENCOUNTER — Ambulatory Visit (HOSPITAL_COMMUNITY)
Admission: RE | Admit: 2015-12-23 | Discharge: 2015-12-23 | Disposition: A | Discharge status: Home / Self Care | Payer: PPO | Source: Ambulatory Visit | Attending: Oncology | Admitting: Oncology

## 2015-12-23 DIAGNOSIS — D649 Anemia, unspecified: Secondary | ICD-10-CM | POA: Diagnosis not present

## 2015-12-23 DIAGNOSIS — Z66 Do not resuscitate: Secondary | ICD-10-CM | POA: Insufficient documentation

## 2015-12-23 DIAGNOSIS — C541 Malignant neoplasm of endometrium: Secondary | ICD-10-CM

## 2015-12-23 LAB — PREPARE RBC (CROSSMATCH)

## 2015-12-23 MED ORDER — HEPARIN SOD (PORK) LOCK FLUSH 100 UNIT/ML IV SOLN: 250.0000 [IU] | INTRAVENOUS | Status: DC | PRN

## 2015-12-23 MED ORDER — SODIUM CHLORIDE 0.9% FLUSH: 10.0000 mL | INTRAVENOUS | Status: DC | PRN

## 2015-12-23 MED ORDER — HEPARIN SOD (PORK) LOCK FLUSH 100 UNIT/ML IV SOLN: 500.0000 [IU] | Freq: Every day | INTRAVENOUS | Status: DC | PRN

## 2015-12-23 MED ORDER — SODIUM CHLORIDE 0.9% FLUSH: 3.0000 mL | INTRAVENOUS | Status: DC | PRN

## 2015-12-23 MED ORDER — SODIUM CHLORIDE 0.9 % IV SOLN
250.0000 mL | Freq: Once | INTRAVENOUS | Status: AC
  Administered 2015-12-23: 250 mL via INTRAVENOUS

## 2015-12-23 MED ORDER — ACETAMINOPHEN 325 MG PO TABS
325.0000 mg | ORAL_TABLET | Freq: Once | ORAL | Status: AC
  Administered 2015-12-23: 325 mg via ORAL
  Filled 2015-12-23: qty 1

## 2015-12-23 NOTE — Telephone Encounter (Signed)
-----   Message from Gordy Levan, MD sent at 12/23/2015  4:14 PM EDT ----- On 8-30 or 8-31, please make referral to [facility] Diagnosis: endometrial cancer No chemo or radiation DNR  Dr Marko Plume will be attending for Hospice  Please be sure they see my note from 8-28 and tell them that patient is particularly interested in [place] for the 66 yo great grandson who lives with her.  thanks

## 2015-12-23 NOTE — Progress Notes (Signed)
Medical Oncology  UA from 12-22-15 moderate LE, 21-50 WBC, also large blood and TNTC RBCs. Begam macrobid empirically. follow up culture, DC antibiotic if no significant infection.  L.Livesay. MD

## 2015-12-23 NOTE — Discharge Instructions (Signed)
Blood Transfusion, Care After  These instructions give you information about caring for yourself after your procedure. Your doctor may also give you more specific instructions. Call your doctor if you have any problems or questions after your procedure.   HOME CARE   Take medicines only as told by your doctor. Ask your doctor if you can take an over-the-counter pain reliever if you have a fever or headache a day or two after your procedure.   Return to your normal activities as told by your doctor.  GET HELP IF:    You develop redness or irritation at your IV site.   You have a fever, chills, or a headache that does not go away.   Your pee (urine) is darker than normal.   Your urine turns:    Pink.    Red.    Brown.   The white part of your eye turns yellow (jaundice).   You feel weak after doing your normal activities.  GET HELP RIGHT AWAY IF:    You have trouble breathing.   You have fever and chills and you also have:    Anxiety.    Chest or back pain.    Flushed or pink skin.    Clammy or sweaty skin.    A fast heartbeat.    A sick feeling in your stomach (nausea).     This information is not intended to replace advice given to you by your health care provider. Make sure you discuss any questions you have with your health care provider.     Document Released: 05/03/2014 Document Reviewed: 05/03/2014  Elsevier Interactive Patient Education 2016 Elsevier Inc.

## 2015-12-23 NOTE — Telephone Encounter (Signed)
Spoke with [employer] with Hospice. Gave referral information as noted below by Dr. Marko Plume. Amber stated that she can get Dr. Mariana Kaufman note from 12-22-15 from [facility] so the note does not need to be faxed to Hospice.

## 2015-12-23 NOTE — Progress Notes (Signed)
Diagnosis Association: Endometrial cancer ([place]) (C54.1)  MD: Dr. Marko Plume  Procedure: Pt received 2 units of PRBC's  Pt tolerated well.  Post procedure: Pt alert, oriented and ambulatory to wheelchair. Discharged to home with family member. Discharge instructions given to patient with verbal understanding.

## 2015-12-24 ENCOUNTER — Telehealth: Payer: Self-pay

## 2015-12-24 ENCOUNTER — Encounter: Payer: Self-pay | Admitting: Oncology

## 2015-12-24 LAB — URINE CULTURE

## 2015-12-24 LAB — TYPE AND SCREEN
ABO/RH(D): O POS
Antibody Screen: NEGATIVE
Unit division: 0
Unit division: 0

## 2015-12-24 NOTE — Progress Notes (Signed)
sent to envision via covermymeds- envision approved nitrofurantion [id number]. sent to medical rcrds

## 2015-12-24 NOTE — Telephone Encounter (Signed)
-----   Message from Gordy Levan, MD sent at 12/24/2015  8:55 AM EDT ----- Labs seen and need follow up: no infection, can stop antibiotics.  thanks

## 2015-12-24 NOTE — Progress Notes (Signed)
Via covermymeds sent prior auth to envision    nitrofu

## 2015-12-24 NOTE — Telephone Encounter (Signed)
Spoke with Kristen Greene and gave her the results of the Urine culture as noted below by Dr. Marko Plume. Kristen Greene p/u prescription yesterday but has not stared the medication.  She is upset that she spent $36 on medication that she is not going to take.  She is still having urinary burning.  Suggested that she increase her fluid intake.

## 2016-01-02 ENCOUNTER — Telehealth: Payer: Self-pay

## 2016-01-02 NOTE — Telephone Encounter (Addendum)
Spoke with Jinny Blossom about Kristen Greene feeling dizzy,weak, and light headed yesterday and requesting lab work to be done for a possible blood transfusion. Medication for GERD symptoms.   Jinny Blossom stated that she has not been able to get in touch with Kristen Greene.  She has called multiple times and the phone is  not on. Shared with Jinny Blossom that Kristen. Greene has not been responsive to calls from [facility]. Megan to inquire if Kristen Greene can receive a blood transfusion as a hospice patient. Jinny Blossom will reach out to Kristen Greene next week and call this office with an update on Kristen Greene' condition. Told Megan that Dr. Marko Plume recommended Pepcid 20 mg bid for her GERD symptoms. Pt would purchase this OTC as hospice will not cover medication.

## 2016-01-04 ENCOUNTER — Other Ambulatory Visit: Payer: Self-pay

## 2016-01-04 ENCOUNTER — Emergency Department (HOSPITAL_COMMUNITY)
Admission: EM | Admit: 2016-01-04 | Discharge: 2016-01-04 | Disposition: A | Discharge status: Home / Self Care | Payer: PPO | Attending: Emergency Medicine | Admitting: Emergency Medicine

## 2016-01-04 ENCOUNTER — Encounter (HOSPITAL_COMMUNITY): Payer: Self-pay | Admitting: Emergency Medicine

## 2016-01-04 DIAGNOSIS — Z8542 Personal history of malignant neoplasm of other parts of uterus: Secondary | ICD-10-CM | POA: Diagnosis not present

## 2016-01-04 DIAGNOSIS — Z8541 Personal history of malignant neoplasm of cervix uteri: Secondary | ICD-10-CM | POA: Insufficient documentation

## 2016-01-04 DIAGNOSIS — E119 Type 2 diabetes mellitus without complications: Secondary | ICD-10-CM | POA: Insufficient documentation

## 2016-01-04 DIAGNOSIS — Z8544 Personal history of malignant neoplasm of other female genital organs: Secondary | ICD-10-CM | POA: Insufficient documentation

## 2016-01-04 DIAGNOSIS — R531 Weakness: Secondary | ICD-10-CM | POA: Insufficient documentation

## 2016-01-04 DIAGNOSIS — R1084 Generalized abdominal pain: Secondary | ICD-10-CM | POA: Diagnosis not present

## 2016-01-04 DIAGNOSIS — R42 Dizziness and giddiness: Secondary | ICD-10-CM | POA: Diagnosis present

## 2016-01-04 DIAGNOSIS — Z87891 Personal history of nicotine dependence: Secondary | ICD-10-CM | POA: Insufficient documentation

## 2016-01-04 LAB — COMPREHENSIVE METABOLIC PANEL
ALT: 9 U/L — ABNORMAL LOW (ref 14–54)
AST: 19 U/L (ref 15–41)
Albumin: 3.1 g/dL — ABNORMAL LOW (ref 3.5–5.0)
Alkaline Phosphatase: 108 U/L (ref 38–126)
Anion gap: 10 (ref 5–15)
BUN: 8 mg/dL (ref 6–20)
CO2: 25 mmol/L (ref 22–32)
Calcium: 8.9 mg/dL (ref 8.9–10.3)
Chloride: 106 mmol/L (ref 101–111)
Creatinine, Ser: 0.69 mg/dL (ref 0.44–1.00)
GFR calc Af Amer: >60 mL/min (ref >60)
GFR calc non Af Amer: >60 mL/min (ref >60)
Glucose, Bld: 102 mg/dL — ABNORMAL HIGH (ref 65–99)
Potassium: 3.1 mmol/L — ABNORMAL LOW (ref 3.5–5.1)
Sodium: 141 mmol/L (ref 135–145)
Total Bilirubin: 0.4 mg/dL (ref 0.3–1.2)
Total Protein: 7.1 g/dL (ref 6.5–8.1)

## 2016-01-04 LAB — CBC
HCT: 29.3 % — ABNORMAL LOW (ref 36.0–46.0)
Hemoglobin: 8.7 g/dL — ABNORMAL LOW (ref 12.0–15.0)
MCH: 22.3 pg — ABNORMAL LOW (ref 26.0–34.0)
MCHC: 29.7 g/dL — ABNORMAL LOW (ref 30.0–36.0)
MCV: 75.1 fL — ABNORMAL LOW (ref 78.0–100.0)
Platelets: 373 10*3/uL (ref 150–400)
RBC: 3.9 MIL/uL (ref 3.87–5.11)
RDW: 20.5 % — ABNORMAL HIGH (ref 11.5–15.5)
WBC: 7.1 10*3/uL (ref 4.0–10.5)

## 2016-01-04 LAB — TYPE AND SCREEN
ABO/RH(D): O POS
Antibody Screen: NEGATIVE

## 2016-01-04 MED ORDER — POTASSIUM CHLORIDE CRYS ER 20 MEQ PO TBCR
40.0000 meq | EXTENDED_RELEASE_TABLET | Freq: Once | ORAL | Status: AC
  Administered 2016-01-04: 40 meq via ORAL
  Filled 2016-01-04: qty 2

## 2016-01-04 NOTE — ED Notes (Signed)
Patient Alert and oriented X4. Stable and ambulatory. Patient verbalized understanding of the discharge instructions.  Patient belongings were taken by the patient.  

## 2016-01-04 NOTE — ED Provider Notes (Signed)
[facility] DEPT Provider Note   CSN: [id number] Arrival date & time: 01/04/16  1259  By signing my name below, I, Hansel Feinstein, attest that this documentation has been prepared under the direction and in the presence of Virgel Manifold, MD. Electronically Signed: Hansel Feinstein, ED Scribe. 01/04/16. 3:25 PM.     History   Chief Complaint Chief Complaint  Patient presents with  . Weakness    HPI Kristen Greene is a 65 y.o. female with h/o anemia, endometrial cancer, fibroids who presents to the Emergency Department complaining of moderate, gradually worsening dizziness x 3 days with associated HA onset last night. Pt states her dizziness is worsened with positional changes, is associated with lightheadedness and feeling as though she will "pass out". She also complains of intermittent abdominal discomfort and decreased appetite. Pt states her current dizziness is similar to prior episodes of anemia. She was recently admitted to the hospital on 12/14/15 for symptomatic anemia and d/c with rx for Hemocyte, Bactrim and Thera. Hgb 13 days ago was 7.8 compared to 8.7 in the ED today. No h/o similar HA. Pt states her HA is not worsened with light or sound. No recent changes in medications. She denies SOB, dysuria, fever, chills, worsened vaginal bleeding from baseline.   The history is provided by the patient. No language interpreter was used.    Past Medical History:  Diagnosis Date  . Anemia 11/2015  . Diabetes mellitus without complication (HCC)    borderline  . Endometrial cancer ([place]) dx'd 04/2015   FIGO STage IIIB  . Fibroids   . Reflux   . Sickle-cell trait ([place])   . UTI (lower urinary tract infection) 11/2015    Patient Active Problem List   Diagnosis Date Noted  . DNR (do not resuscitate) 12/23/2015  . Symptomatic anemia 12/15/2015  . UTI (urinary tract infection) 12/15/2015  . Malnutrition of moderate degree 12/15/2015  . Vaginal bleeding, abnormal 07/31/2015  . H/O  noncompliance with medical treatment, presenting hazards to health 07/31/2015  . Postmenopausal vaginal bleeding 06/25/2015  . Iron deficiency anemia due to chronic blood loss 06/06/2015  . Sickle cell trait ([place]) 06/06/2015  . International Federation of Gynecology and Obstetrics (FIGO) stage IIIB malignant neoplasm of endometrium ([place]) 06/06/2015  . Endometrial cancer, FIGO stage IIIB ([place]) 05/30/2015  . 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 DILATATION & CURETTAGE/HYSTEROSCOPY WITH MYOSURE N/A 05/14/2015   Procedure: DILATATION & CURETTAGE/HYSTEROSCOPY ;  Surgeon: Sherlyn Hay, DO;  Location: [facility];  Service: Gynecology;  Laterality: N/A;  . LEEP N/A 05/14/2015   Procedure: LOOP ELECTROSURGICAL EXCISION PROCEDURE (LEEP);  Surgeon: Sherlyn Hay, DO;  Location: [facility];  Service: Gynecology;  Laterality: N/A;  . TONSILLECTOMY    . TUBAL LIGATION      OB History    No data available       Home Medications    Prior to Admission medications   Medication Sig Start Date End Date Taking? Authorizing Provider  ferrous fumarate (HEMOCYTE - 106 MG FE) 325 (106 Fe) MG TABS tablet Take 1 tablet daily on an Tempty stomach  With OJ or Vitamin C  tablet. 06/11/15   Lennis Marion Downer, MD  Multiple Vitamin (THERA) TABS Take by mouth.    Historical Provider, MD  nitrofurantoin, macrocrystal-monohydrate, (MACROBID) 100 MG capsule Take 1 capsule (100  mg total) by mouth 2 (two) times daily. 12/22/15   Lennis Marion Downer, MD    Family History Family History  Problem Relation Age of Onset  . Diabetes Mother   . CAD Father 18    Social History Social History  Substance Use Topics  . Smoking status: Former Research scientist (life sciences)  . Smokeless tobacco: Never Used  . Alcohol use No     Allergies   Feraheme  [ferumoxytol]   Review of Systems Review of Systems  Constitutional: Positive for appetite change. Negative for chills and fever.  Respiratory: Negative for shortness of breath.   Gastrointestinal: Positive for abdominal pain.  Genitourinary: Positive for vaginal bleeding (baseline). Negative for dysuria.  Neurological: Positive for dizziness, weakness, light-headedness and headaches.  All other systems reviewed and are negative.    Physical Exam Updated Vital Signs BP 116/75   Pulse 95   Temp 98.1 F (36.7 C) (Oral)   Resp 17   Ht 5' 1.5" (1.562 m)   Wt 143 lb 5 oz (65 kg)   SpO2 100%   BMI 26.64 kg/m   Physical Exam  Constitutional: She appears well-developed and well-nourished. No distress.  HENT:  Head: Normocephalic and atraumatic.  Mouth/Throat: Oropharynx is clear and moist. No oropharyngeal exudate.  Eyes: Conjunctivae and EOM are normal. Pupils are equal, round, and reactive to light. Right eye exhibits no discharge. Left eye exhibits no discharge. No scleral icterus.  Neck: Normal range of motion. Neck supple. No JVD present. No thyromegaly present.  Cardiovascular: Normal rate, regular rhythm, normal heart sounds and intact distal pulses.  Exam reveals no gallop and no friction rub.   No murmur heard. Pulmonary/Chest: Effort normal and breath sounds normal. No respiratory distress. She has no wheezes. She has no rales.  Abdominal: Soft. Bowel sounds are normal. She exhibits no distension and no mass. There is tenderness.  Mild diffuse abdominal tenderness.   Musculoskeletal: Normal range of motion. She exhibits no edema or tenderness.  Lymphadenopathy:    She has no cervical adenopathy.  Neurological: She is alert. Coordination normal.  Skin: Skin is warm and dry. No rash noted. No erythema.  Psychiatric: She has a normal mood and affect. Her behavior is normal.  Nursing note and vitals reviewed.    ED Treatments / Results   DIAGNOSTIC STUDIES: Oxygen  Saturation is 100% on RA, normal by my interpretation.    COORDINATION OF CARE: 3:23 PM Discussed treatment plan with pt at bedside which includes lab work and pt agreed to plan.    Labs (all labs ordered are listed, but only abnormal results are displayed) Labs Reviewed  COMPREHENSIVE METABOLIC PANEL - Abnormal; Notable for the following:       Result Value   Potassium 3.1 (*)    Glucose, Bld 102 (*)    Albumin 3.1 (*)    ALT 9 (*)    All other components within normal limits  CBC - Abnormal; Notable for the following:    Hemoglobin 8.7 (*)    HCT 29.3 (*)    MCV 75.1 (*)    MCH 22.3 (*)    MCHC 29.7 (*)    RDW 20.5 (*)    All other components within normal limits  POC OCCULT BLOOD, ED  TYPE AND SCREEN    EKG  EKG Interpretation  Date/Time:  Sunday January 04 2016 15:39:43 EDT Ventricular Rate:  90 PR Interval:    QRS Duration: 95 QT Interval:  369 QTC Calculation: 452 R Axis:   -  7 Text Interpretation:  Sinus rhythm Probable left atrial enlargement Nonspecific T abnormalities, diffuse leads No significant change since last tracing Confirmed by Wilson Singer  MD, Delara Shepheard [phone]) on 01/04/2016 4:10:12 PM       Radiology No results found.  Procedures Procedures (including critical care time)  Medications Ordered in ED Medications  potassium chloride SA (K-DUR,KLOR-CON) CR tablet 40 mEq (not administered)     Initial Impression / Assessment and Plan / ED Course  I have reviewed the triage vital signs and the nursing notes.  Pertinent labs & imaging results that were available during my care of the patient were reviewed by me and considered in my medical decision making (see chart for details).  Clinical Course    64yF with generalized weakness/dizziness. H/H is stable. Potassium mildly low, but probably not contributory. HD stable. She appears well. It has been determined that no acute conditions requiring further emergency intervention are present at this time.  The patient has been advised of the diagnosis and plan. I reviewed any labs and imaging including any potential incidental findings. We have discussed signs and symptoms that warrant return to the ED and they are listed in the discharge instructions.    Final Clinical Impressions(s) / ED Diagnoses   Final diagnoses:  Generalized weakness    New Prescriptions New Prescriptions   No medications on file    I personally preformed the services scribed in my presence. The recorded information has been reviewed is accurate. Virgel Manifold, MD.    Virgel Manifold, MD 01/07/16 [phone]

## 2016-01-04 NOTE — ED Triage Notes (Signed)
Pt sts generalized weakness and dizziness; pt sts thinks her blood count is low and has felt the same in past; pt denies other complaint

## 2016-01-12 ENCOUNTER — Emergency Department (HOSPITAL_COMMUNITY)
Admission: EM | Admit: 2016-01-12 | Discharge: 2016-01-13 | Disposition: A | Discharge status: Home / Self Care | Payer: PPO | Attending: Emergency Medicine | Admitting: Emergency Medicine

## 2016-01-12 ENCOUNTER — Encounter (HOSPITAL_COMMUNITY): Payer: Self-pay | Admitting: Emergency Medicine

## 2016-01-12 ENCOUNTER — Other Ambulatory Visit: Payer: Self-pay

## 2016-01-12 DIAGNOSIS — E860 Dehydration: Secondary | ICD-10-CM | POA: Insufficient documentation

## 2016-01-12 DIAGNOSIS — Z79899 Other long term (current) drug therapy: Secondary | ICD-10-CM | POA: Diagnosis not present

## 2016-01-12 DIAGNOSIS — R4182 Altered mental status, unspecified: Secondary | ICD-10-CM | POA: Insufficient documentation

## 2016-01-12 DIAGNOSIS — Z87891 Personal history of nicotine dependence: Secondary | ICD-10-CM | POA: Diagnosis not present

## 2016-01-12 DIAGNOSIS — Z8541 Personal history of malignant neoplasm of cervix uteri: Secondary | ICD-10-CM | POA: Diagnosis not present

## 2016-01-12 DIAGNOSIS — E119 Type 2 diabetes mellitus without complications: Secondary | ICD-10-CM | POA: Insufficient documentation

## 2016-01-12 DIAGNOSIS — R000 Tachycardia, unspecified: Secondary | ICD-10-CM | POA: Diagnosis not present

## 2016-01-12 DIAGNOSIS — R509 Fever, unspecified: Secondary | ICD-10-CM | POA: Diagnosis not present

## 2016-01-12 DIAGNOSIS — Z8542 Personal history of malignant neoplasm of other parts of uterus: Secondary | ICD-10-CM | POA: Insufficient documentation

## 2016-01-12 DIAGNOSIS — Z8544 Personal history of malignant neoplasm of other female genital organs: Secondary | ICD-10-CM | POA: Insufficient documentation

## 2016-01-12 MED ORDER — ACETAMINOPHEN 650 MG RE SUPP
650.0000 mg | Freq: Once | RECTAL | Status: AC
  Administered 2016-01-12: 650 mg via RECTAL
  Filled 2016-01-12: qty 1

## 2016-01-12 MED ORDER — SODIUM CHLORIDE 0.9 % IV BOLUS (SEPSIS)
500.0000 mL | Freq: Once | INTRAVENOUS | Status: AC
  Administered 2016-01-12: 500 mL via INTRAVENOUS

## 2016-01-12 NOTE — ED Notes (Signed)
Bed: RESB Expected date:  Expected time:  Means of arrival:  Comments: EMS altered LOC / tachy / sepsis

## 2016-01-12 NOTE — ED Triage Notes (Signed)
Pt has been unresponsive  For past 3 days, last normal baseline was one week ago. Hospice and DNR.  Family wants fluids and temperature control. Stage 3,  Cancer. Pt is flaccid on upper, intermit eye opening.  Irregular breathing pattern,  Tachy 140.  Iv 18 in right hand, 500 NS in, and 500 kvo now.  Family at bed side.   V/s on 111/79/  Hr 143, 100 percent room air. cbg 173.  Was on [employer]  2 liters.

## 2016-01-13 ENCOUNTER — Telehealth: Payer: Self-pay

## 2016-01-13 NOTE — Telephone Encounter (Signed)
Kristen Greene states that family wants IVF for Kristen Greene to help keep her going. Patient brought to ED last night by family.  Physican stating that Kristen Greene was actively dying and followed up with Hospice nurse on call to verify patient's wishes for DNR and comfort care.   Kristen Greene did not want to be hospitalized as well. She wants to dye at home.   Some family members stated this as well and Kristen Greene was discharged to home. Kristen Greene has reviewed information regarding end of life transitioning and how fluids would not bring Kristen Greene comfort. Dr Marko Plume would concur with this.  Kristen Greene will follow up with family later today.  [facility] admission offered but declined. Kristen Greene will request the Hospice physician to come to visit with her tomorrow to discuss situation with the family if patient has not passed during the night.

## 2016-01-13 NOTE — ED Provider Notes (Signed)
[facility] DEPT Provider Note   CSN: [id number] Arrival date & time: 01/12/16  2011     History   Chief Complaint Chief Complaint  Patient presents with  . Altered Mental Status    unreponsive ( DNR) hospice     HPI Kristen Greene is a 65 y.o. female.  65 year old female with past medical history below including end-stage cancer who presents with altered mental status and fever. Son reports that the patient was last normal approximately one week ago. Since then she has been altered and not herself, unresponsive and not eating. She has been running high fevers, 104-106, for the past few days. He has not noticed any skin rash and she has not had any vomiting. One time she complained of abdominal pain but has not complained of pain recently. She is being followed by home hospice and does not want any interventions including no medications. They have been giving her Tylenol for her fever, last dose was at noon.  LEVEL 5 CAVEAT DUE TO AMS   The history is provided by a relative.  Altered Mental Status      Past Medical History:  Diagnosis Date  . Anemia 11/2015  . Diabetes mellitus without complication (HCC)    borderline  . Endometrial cancer ([place]) dx'd 04/2015   FIGO [place] IIIB  . Fibroids   . Reflux   . Sickle-cell trait ([place])   . UTI (lower urinary tract infection) 11/2015    Patient Active Problem List   Diagnosis Date Noted  . DNR (do not resuscitate) 12/23/2015  . Symptomatic anemia 12/15/2015  . UTI (urinary tract infection) 12/15/2015  . Malnutrition of moderate degree 12/15/2015  . Vaginal bleeding, abnormal 07/31/2015  . H/O noncompliance with medical treatment, presenting hazards to health 07/31/2015  . Postmenopausal vaginal bleeding 06/25/2015  . Iron deficiency anemia due to chronic blood loss 06/06/2015  . Sickle cell trait ([place]) 06/06/2015  . International Federation of Gynecology and Obstetrics (FIGO) stage IIIB malignant neoplasm of endometrium  ([place]) 06/06/2015  . Endometrial cancer, FIGO stage IIIB ([place]) 05/30/2015  . 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 DILATATION & CURETTAGE/HYSTEROSCOPY WITH MYOSURE N/A 05/14/2015   Procedure: DILATATION & CURETTAGE/HYSTEROSCOPY ;  Surgeon: Sherlyn Hay, DO;  Location: [facility];  Service: Gynecology;  Laterality: N/A;  . LEEP N/A 05/14/2015   Procedure: LOOP ELECTROSURGICAL EXCISION PROCEDURE (LEEP);  Surgeon: Sherlyn Hay, DO;  Location: [facility];  Service: Gynecology;  Laterality: N/A;  . TONSILLECTOMY    . TUBAL LIGATION      OB History    No data available       Home Medications    Prior to Admission medications   Medication Sig Start Date End Date Taking? Authorizing Provider  ferrous fumarate (HEMOCYTE - 106 MG FE) 325 (106 Fe) MG TABS tablet Take 1 tablet daily on an Tempty stomach  With OJ or Vitamin C  tablet. Patient not taking: Reported on 01/12/2016 06/11/15   Gordy Levan, MD  nitrofurantoin, macrocrystal-monohydrate, (MACROBID) 100 MG capsule Take 1 capsule (100 mg total) by mouth 2 (two) times daily. Patient not taking: Reported on 01/12/2016 12/22/15   Gordy Levan, MD    Family History Family History  Problem Relation Age of Onset  . Diabetes Mother   . CAD Father 41  Social History Social History  Substance Use Topics  . Smoking status: Former Research scientist (life sciences)  . Smokeless tobacco: Never Used  . Alcohol use No     Allergies   Feraheme [ferumoxytol]   Review of Systems Review of Systems  Unable to perform ROS: Mental status change     Physical Exam Updated Vital Signs BP 112/66 (BP Location: Right Arm)   Pulse 66   Temp 100 F (37.8 C) (Rectal)   Resp 21   SpO2 93%   Physical Exam  Constitutional:  Thin, frail woman, altered and unresponsive    HENT:  Head: Normocephalic and atraumatic.  Very dry mucous membranes  Eyes: Conjunctivae are normal. Pupils are equal, round, and reactive to light.  Neck: Neck supple.  Cardiovascular: Regular rhythm and normal heart sounds.  Tachycardia present.   No murmur heard. Pulmonary/Chest: Breath sounds normal. Tachypnea noted.  Abdominal: Soft. Bowel sounds are normal. She exhibits no distension. There is no tenderness.  Musculoskeletal: She exhibits no edema.  Neurological:  Altered, unresponsive, unable to follow commands  Skin: Skin is warm and dry.  Nursing note and vitals reviewed.    ED Treatments / Results  Labs (all labs ordered are listed, but only abnormal results are displayed) Labs Reviewed - No data to display  EKG  EKG Interpretation None       Radiology No results found.  Procedures .Critical Care Performed by: Sharlett Iles Authorized by: Sharlett Iles   Critical care provider statement:    Critical care time (minutes):  45   Critical care was necessary to treat or prevent imminent or life-threatening deterioration of the following conditions:  Cardiac failure and respiratory failure   Critical care was time spent personally by me on the following activities:  Development of treatment plan with patient or surrogate, discussions with consultants, examination of patient, obtaining history from patient or surrogate, re-evaluation of patient's condition and review of old charts   (including critical care time)  Medications Ordered in ED Medications  sodium chloride 0.9 % bolus 500 mL (500 mLs Intravenous New Bag/Given 01/12/16 2249)  acetaminophen (TYLENOL) suppository 650 mg (650 mg Rectal Given 01/12/16 2249)     Initial Impression / Assessment and Plan / ED Course  I have reviewed the triage vital signs and the nursing notes.   Clinical Course   Pt p/w altered mentation x 1 week and intermittent fevers, she is under hospice care and  has requested comfort measures only. She was unresponsive on exam, tachycardic and tachypneic. Son stated that the family had agreed on IV fluids only and no medication or medical workup. I discussed the patient with the hospice nurse on call, who stated that the patient had clearly stated to hospice that she wanted no medical interventions such as medications or hospitalization. Later the patient's other family members arrived and they all agreed that the patient would want to go home to die. I explained that the patient appears to be in the active stage of dying and they voiced understanding. Based on her diagnosis and expressed wishes to hospice, I feel that d/c is appropriate. She has been given IV fluids and Tylenol and will be transported home with [facility].   Final Clinical Impressions(s) / ED Diagnoses   Final diagnoses:  Altered mental status, unspecified altered mental status type  Tachycardia  Fever, unspecified fever cause  Dehydration    New Prescriptions New Prescriptions   No medications on file     Wenda Overland  Promyse Ardito, MD 01/13/16 [phone]

## 2016-01-13 NOTE — Discharge Instructions (Signed)
Contact Hospice for any concerns regarding Kristen Greene's comfort care at home.

## 2016-01-22 ENCOUNTER — Encounter: Payer: Self-pay | Admitting: Oncology

## 2016-01-22 NOTE — Progress Notes (Signed)
Medical Oncology  Death Certificate received and signed. Sympathy letter written to family. WIll let other physicians involved know by this entry, thank you.  Evlyn Clines, MD

## 2016-01-25 DEATH — deceased

## 2016-04-03 ENCOUNTER — Other Ambulatory Visit: Payer: Self-pay | Admitting: Nurse Practitioner

## 2016-08-18 ENCOUNTER — Telehealth: Payer: Self-pay | Admitting: Oncology

## 2016-08-18 NOTE — Telephone Encounter (Signed)
Faxed office notes to [employer] [phone]

## 2016-11-18 IMAGING — CT CT ABD-PELV W/ CM
2 of 5 series · 15 of 36 positions shown, 18 images · IV contrast (omnipaque)
Comparison: None.

CLINICAL DATA: Newly diagnosed high-grade uterine carcinoma with
cervical and vaginal involvement. Weight loss.

EXAM:
CT CHEST, ABDOMEN, AND PELVIS WITH CONTRAST
TECHNIQUE: Multidetector CT imaging of the chest, abdomen and pelvis was
performed following the standard protocol during bolus
administration of intravenous contrast.
CONTRAST:  100mL OMNIPAQUE IOHEXOL 300 MG/ML  SOLN

[Series 2: cap with st · axial · 0.80mm/px · z∈[-558,-53]mm · 12 of 117 slices shown, 15 images]
[im 8/117  mediastinal]
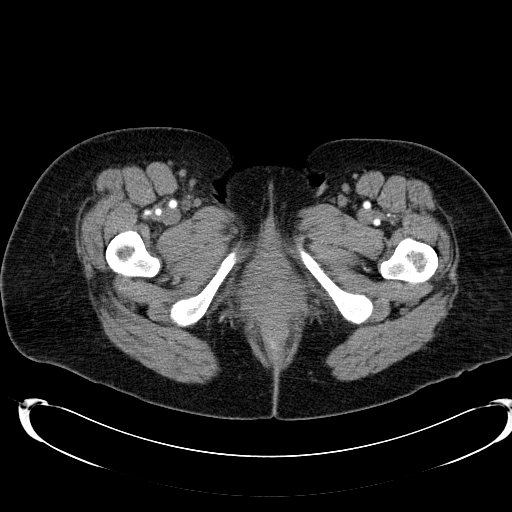
[im 8/117  lung]
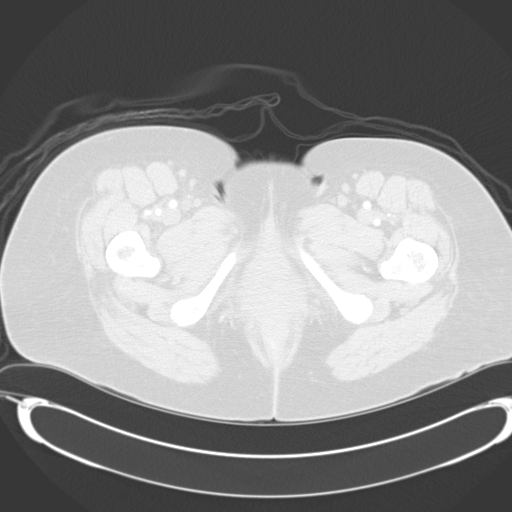
[im 16/117  lung]
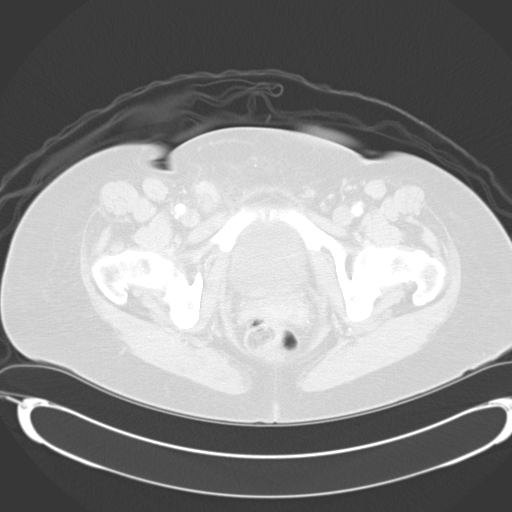
[im 24/117  lung]
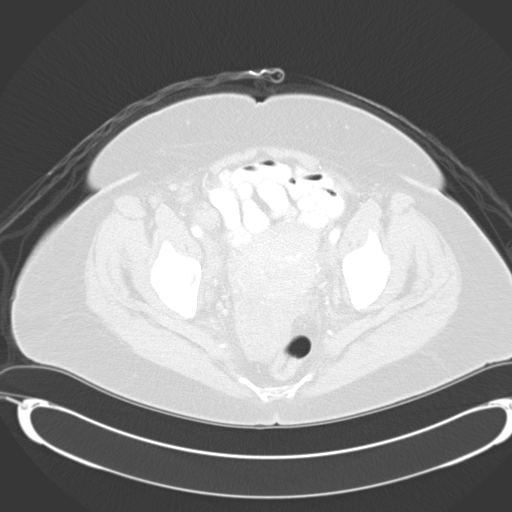
[im 39/117  lung]
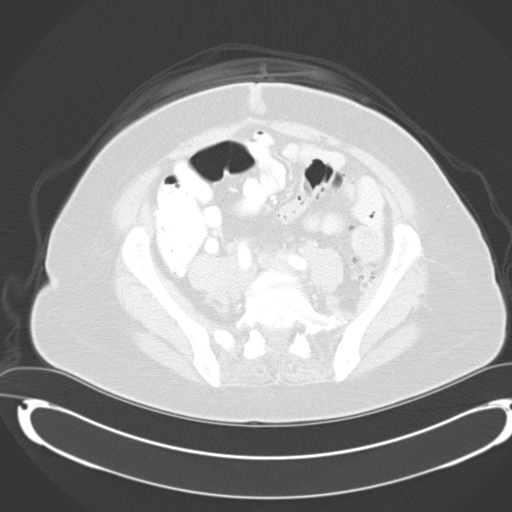
[im 47/117  mediastinal]
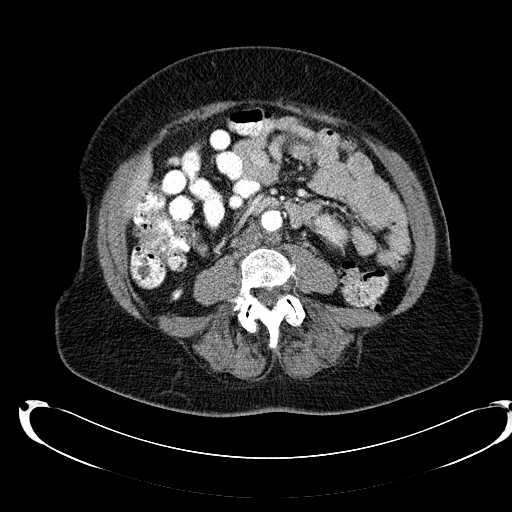
[im 47/117  lung]
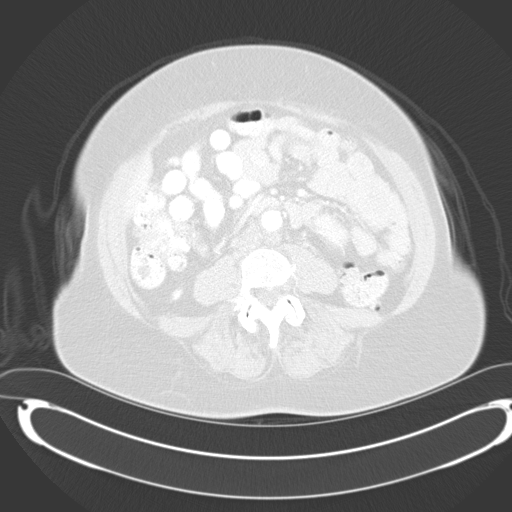
[im 55/117  lung]
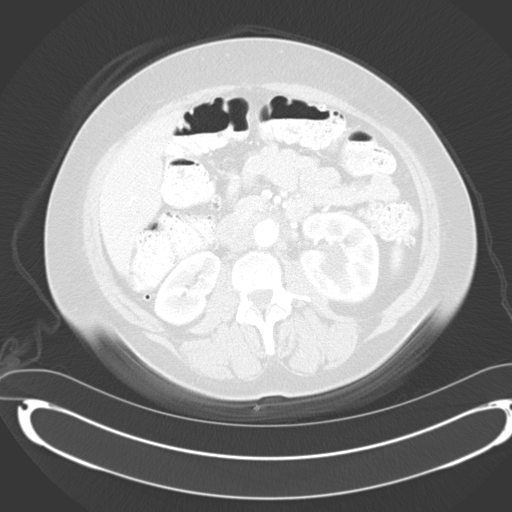
[im 62/117  lung]
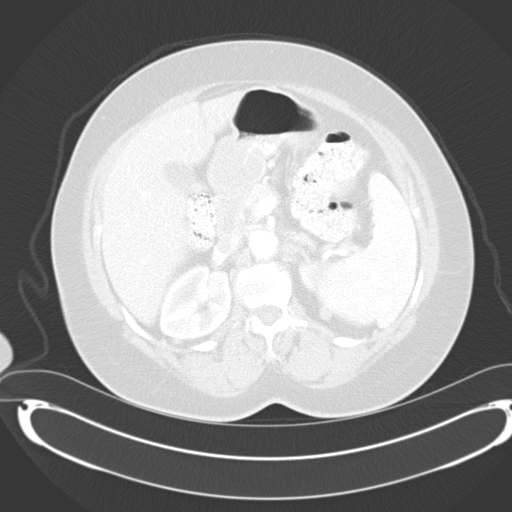
[im 70/117  lung]
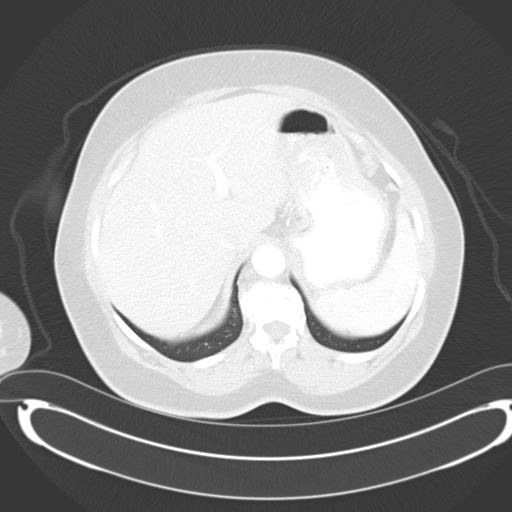
[im 78/117  mediastinal]
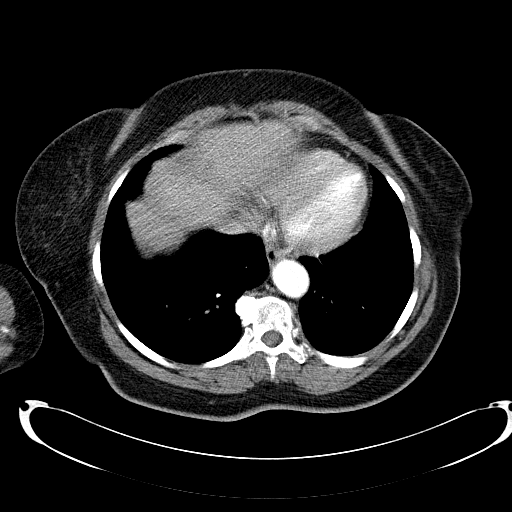
[im 78/117  lung]
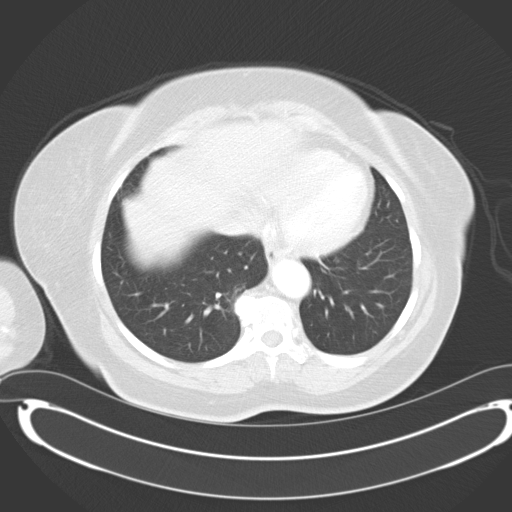
[im 93/117  lung]
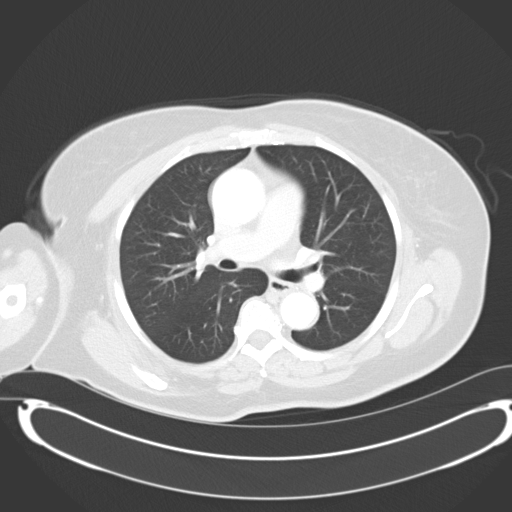
[im 101/117  lung]
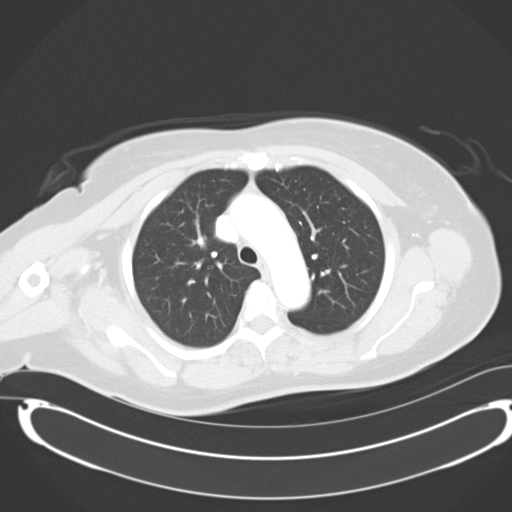
[im 109/117  lung]
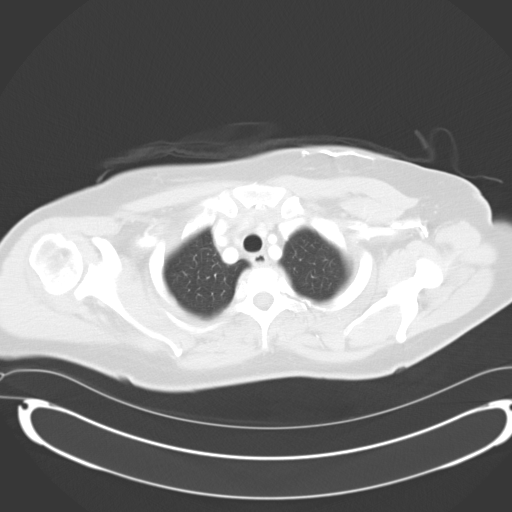

[Series 602: coronal images · coronal · 1.17mm/px · 3 of 97 slices shown]
[im 20/97  lung]
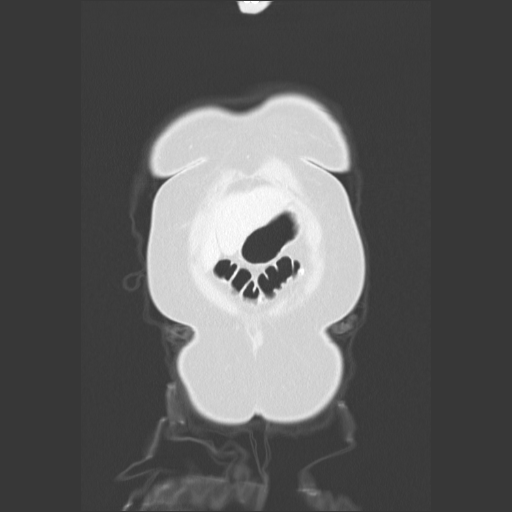
[im 39/97  lung]
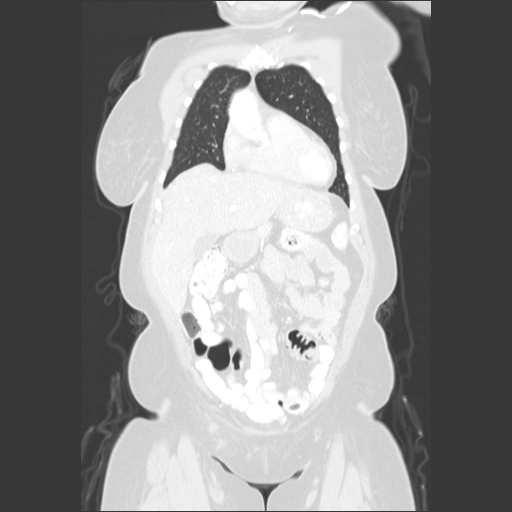
[im 58/97  lung]
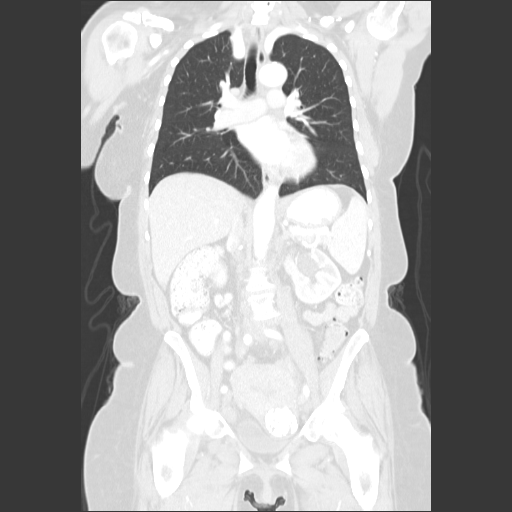

[15 of 36 positions shown; findings below may reference images not displayed]

FINDINGS: CT CHEST FINDINGS

Mediastinum/Lymph Nodes: No masses, pathologically enlarged lymph
nodes, or other significant abnormality.

Lungs/Pleura: No pulmonary mass, infiltrate, or effusion. Mild
scarring noted in medial right lung base.

Musculoskeletal: No chest wall mass or suspicious bone lesions
identified.

CT ABDOMEN PELVIS FINDINGS

Hepatobiliary: No masses or other significant abnormality.

Pancreas: No mass, inflammatory changes, or other significant
abnormality.

Spleen: Within normal limits in size and appearance.

Adrenals/Urinary Tract: No masses identified. No evidence of
hydronephrosis. Left parapelvic renal cyst incidentally noted.

Stomach/Bowel: No evidence of obstruction, inflammatory process, or
abnormal fluid collections.

Vascular/Lymphatic: Mild abdominal retroperitoneal lymphadenopathy
is seen in the left paraaortic and aortocaval space is. Index lymph
noted in the left paraaortic region measures 1.6 cm in short axis on
image 62/series 2. Mild bilateral iliac lymphadenopathy is also seen
with largest lymph node in the right external iliac chain measuring
1.8 cm on image 96 and in the left external iliac chain measuring
1.2 cm on image 90. Right inguinal lymphadenopathy is also seen
measuring 2.3 x 1.7 cm on image 103/series 2.

Reproductive: A calcified fibroid is seen in the anterior uterine
corpus measuring 3.5 cm. Low low-attenuation soft tissue mass is
seen involving the posterior margin of the lower uterine corpus and
cervix and occupying the pelvic cul-de-sac. This measures 7.4 x
by 6.3 cm and abuts the anterior wall of the rectum. This mass does
not appear to involve the vagina or bladder.

Other: None.

Musculoskeletal:  No suspicious bone lesions identified.
IMPRESSION: 7.4 cm soft tissue mass in the pelvic cul-de-sac which involves the
lower uterine corpus and cervix, and also abuts the anterior wall
the rectum.

Mild lymphadenopathy in the abdominal retroperitoneum, bilateral
iliac chains, and right inguinal region, consistent with metastatic
disease.

No evidence of metastatic disease within the thorax.

3 cm anterior calcified uterine fibroid incidentally noted. No
evidence of hydronephrosis.
